# Patient Record
Sex: Male | Born: 1990 | Race: White | Hispanic: No | Marital: Single | Smoking: Current every day smoker
Health system: Southern US, Community
[De-identification: ages and names within clinical notes are randomized; demographics above are authoritative.]

## PROBLEM LIST (undated history)

## (undated) DIAGNOSIS — M25519 Pain in unspecified shoulder: Secondary | ICD-10-CM

## (undated) DIAGNOSIS — M6282 Rhabdomyolysis: Secondary | ICD-10-CM

## (undated) DIAGNOSIS — G8929 Other chronic pain: Secondary | ICD-10-CM

## (undated) DIAGNOSIS — F1910 Other psychoactive substance abuse, uncomplicated: Secondary | ICD-10-CM

---
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
## 2012-06-01 ENCOUNTER — Emergency Department (HOSPITAL_COMMUNITY): Payer: Self-pay

## 2012-06-01 ENCOUNTER — Encounter (HOSPITAL_COMMUNITY): Payer: Self-pay | Admitting: *Deleted

## 2012-06-01 ENCOUNTER — Emergency Department (HOSPITAL_COMMUNITY)
Admission: EM | Admit: 2012-06-01 | Discharge: 2012-06-01 | Disposition: A | Discharge status: Home / Self Care | Payer: Self-pay | Attending: Emergency Medicine | Admitting: Emergency Medicine

## 2012-06-01 DIAGNOSIS — S5000XA Contusion of unspecified elbow, initial encounter: Secondary | ICD-10-CM | POA: Insufficient documentation

## 2012-06-01 DIAGNOSIS — S5001XA Contusion of right elbow, initial encounter: Secondary | ICD-10-CM

## 2012-06-01 DIAGNOSIS — Y9389 Activity, other specified: Secondary | ICD-10-CM | POA: Insufficient documentation

## 2012-06-01 DIAGNOSIS — W06XXXA Fall from bed, initial encounter: Secondary | ICD-10-CM | POA: Insufficient documentation

## 2012-06-01 DIAGNOSIS — Y929 Unspecified place or not applicable: Secondary | ICD-10-CM | POA: Insufficient documentation

## 2012-06-01 MED ORDER — OXYCODONE-ACETAMINOPHEN 5-325 MG PO TABS: ORAL_TABLET | ORAL | Status: DC

## 2012-06-01 MED ORDER — ONDANSETRON 4 MG PO TBDP
4.0000 mg | ORAL_TABLET | Freq: Once | ORAL | Status: AC
  Administered 2012-06-01: 4 mg via ORAL
  Filled 2012-06-01: qty 1

## 2012-06-01 MED ORDER — KETOROLAC TROMETHAMINE 60 MG/2ML IM SOLN
60.0000 mg | Freq: Once | INTRAMUSCULAR | Status: AC
  Administered 2012-06-01: 60 mg via INTRAMUSCULAR
  Filled 2012-06-01: qty 2

## 2012-06-01 MED ORDER — HYDROMORPHONE HCL PF 1 MG/ML IJ SOLN
1.0000 mg | Freq: Once | INTRAMUSCULAR | Status: AC
  Administered 2012-06-01: 1 mg via INTRAMUSCULAR
  Filled 2012-06-01: qty 1

## 2012-06-01 NOTE — ED Notes (Signed)
Fell off bed landing on right elbow, c/o pain to area and unable to move right arm.

## 2012-06-01 NOTE — ED Provider Notes (Signed)
History     CSN: [id number]  Arrival date & time 06/01/12  1831   First MD Initiated Contact with Patient 06/01/12 1855      Chief Complaint  Patient presents with  . Elbow Pain    (Consider location/radiation/quality/duration/timing/severity/associated sxs/prior treatment) HPI Comments: Fell off bed onto R elbow.  No other injuries or complaints.  R hand dominant.  The history is provided by the patient. No language interpreter was used.    History reviewed. No pertinent past medical history.  History reviewed. No pertinent past surgical history.  No family history on file.  History  Substance Use Topics  . Smoking status: Never Smoker   . Smokeless tobacco: Not on file  . Alcohol Use: No      Review of Systems  Constitutional: Negative for fever and chills.  Musculoskeletal:       Elbow injury   Skin: Negative for wound.  Neurological: Negative for numbness.  All other systems reviewed and are negative.    Allergies  Review of patient's allergies indicates no known allergies.  Home Medications  No current outpatient prescriptions on file.  BP 111/62  Pulse 103  Temp 97.9 F (36.6 C) (Oral)  Resp 16  Ht 5\' 7"  (1.702 m)  Wt 145 lb (65.772 kg)  BMI 22.71 kg/m2  SpO2 99%  Physical Exam  Nursing note and vitals reviewed. Constitutional: He is oriented to person, place, and time. He appears well-developed and well-nourished.  HENT:  Head: Normocephalic and atraumatic.  Eyes: EOM are normal.  Neck: Normal range of motion.  Cardiovascular: Normal rate, regular rhythm and intact distal pulses.   Pulmonary/Chest: Effort normal. No respiratory distress.  Abdominal: Soft. He exhibits no distension. There is no tenderness.  Musculoskeletal: He exhibits tenderness.       Right elbow: He exhibits decreased range of motion. He exhibits no swelling, no effusion, no deformity and no laceration. tenderness found. Radial head and olecranon process tenderness  noted.       Pt can extend elbow to ~ 120 degrees.  He has worse pain in radial head area with attempt to supinate.  Skin intact.  Neurological: He is alert and oriented to person, place, and time.  Skin: Skin is warm and dry.  Psychiatric: He has a normal mood and affect. Judgment normal.    ED Course  Procedures (including critical care time)  Labs Reviewed - No data to display Dg Elbow Complete Right  06/01/2012  *RADIOLOGY REPORT*  Clinical Data: Elbow pain post fall  RIGHT ELBOW - COMPLETE 3+ VIEW  Comparison: None  Findings: Four views of the right elbow submitted.  No acute fracture or subluxation.  No posterior fat pad sign.  IMPRESSION: No acute fracture or subluxation.   Original Report Authenticated By: Natasha Mead, M.D.    Ct Elbow Right W/o Cm  06/01/2012  *RADIOLOGY REPORT*  Clinical Data: Larey Seat off bed landing on right elbow.  Complaining of generalized elbow pain.  CT OF THE RIGHT ELBOW WITHOUT CONTRAST  Technique:  Multidetector CT imaging was performed according to the standard protocol. Multiplanar CT image reconstructions were also generated.  Comparison: Current right elbow radiographs  Findings: No fracture or bone lesion.  The elbow joint is normally spaced and aligned.  No degenerative change.  No joint effusion.  Although limited, the biceps tendon, brachialis tendon and triceps tendons are intact.  The surrounding soft tissues are unremarkable.  IMPRESSION: Normal right elbow CT.  No fracture.   Original  Report Authenticated By: Amie Portland, M.D.      1. Contusion of right elbow       MDM  Sling, ice Ibuprofen rx-percocet, 20. Mom intends to find an orthopedist on her own.        Evalina Field, [place] 06/01/12 2205

## 2012-06-02 NOTE — ED Provider Notes (Signed)
Medical screening examination/treatment/procedure(s) were performed by non-physician practitioner and as supervising physician I was immediately available for consultation/collaboration.   Audryna Wendt, MD 06/02/12 0019 

## 2012-08-14 ENCOUNTER — Ambulatory Visit (HOSPITAL_COMMUNITY)
Admission: RE | Admit: 2012-08-14 | Discharge: 2012-08-14 | Disposition: A | Discharge status: Home / Self Care | Payer: BC Managed Care – PPO | Source: Ambulatory Visit | Attending: Internal Medicine | Admitting: Internal Medicine

## 2012-08-14 ENCOUNTER — Other Ambulatory Visit (HOSPITAL_COMMUNITY): Payer: Self-pay | Admitting: Internal Medicine

## 2012-08-14 DIAGNOSIS — M778 Other enthesopathies, not elsewhere classified: Secondary | ICD-10-CM

## 2012-08-14 DIAGNOSIS — M25529 Pain in unspecified elbow: Secondary | ICD-10-CM | POA: Insufficient documentation

## 2014-05-12 HISTORY — PX: ORIF PROXIMAL HUMERUS FRACTURE: SUR951

## 2014-06-04 ENCOUNTER — Emergency Department (HOSPITAL_COMMUNITY)
Admission: EM | Admit: 2014-06-04 | Discharge: 2014-06-04 | Disposition: A | Discharge status: Home / Self Care | Payer: Self-pay | Attending: Emergency Medicine | Admitting: Emergency Medicine

## 2014-06-04 ENCOUNTER — Emergency Department (HOSPITAL_COMMUNITY): Payer: Self-pay

## 2014-06-04 ENCOUNTER — Encounter (HOSPITAL_COMMUNITY): Payer: Self-pay | Admitting: *Deleted

## 2014-06-04 DIAGNOSIS — S42201A Unspecified fracture of upper end of right humerus, initial encounter for closed fracture: Secondary | ICD-10-CM

## 2014-06-04 DIAGNOSIS — T1490XA Injury, unspecified, initial encounter: Secondary | ICD-10-CM

## 2014-06-04 DIAGNOSIS — Y9289 Other specified places as the place of occurrence of the external cause: Secondary | ICD-10-CM | POA: Insufficient documentation

## 2014-06-04 DIAGNOSIS — S0990XA Unspecified injury of head, initial encounter: Secondary | ICD-10-CM | POA: Insufficient documentation

## 2014-06-04 DIAGNOSIS — S42211A Unspecified displaced fracture of surgical neck of right humerus, initial encounter for closed fracture: Secondary | ICD-10-CM | POA: Insufficient documentation

## 2014-06-04 DIAGNOSIS — Y998 Other external cause status: Secondary | ICD-10-CM | POA: Insufficient documentation

## 2014-06-04 DIAGNOSIS — Y9389 Activity, other specified: Secondary | ICD-10-CM | POA: Insufficient documentation

## 2014-06-04 MED ORDER — OXYCODONE-ACETAMINOPHEN 5-325 MG PO TABS: 1.0000 | ORAL_TABLET | ORAL | Status: DC | PRN

## 2014-06-04 MED ORDER — MORPHINE SULFATE 4 MG/ML IJ SOLN
4.0000 mg | Freq: Once | INTRAMUSCULAR | Status: AC
  Administered 2014-06-04: 4 mg via INTRAVENOUS
  Filled 2014-06-04: qty 1

## 2014-06-04 NOTE — Discharge Instructions (Signed)
Shoulder Fracture You have a fractured humerus (bone in the upper arm) at the shoulder just below the ball of the shoulder joint. Most of the time the bones of a broken shoulder are in an acceptable position. Usually the injury can be treated with a shoulder immobilizer or sling and swath bandage. These devices support the arm and prevent any shoulder movement. If the bones are not in a good position, then surgery is sometimes needed. Shoulder fractures usually cause swelling, pain, and discoloration around the upper arm initially. They heal in 8-12 weeks with proper treatment. Rest in bed or a reclining chair as long as your shoulder is very painful. Sitting up generally results in less pain at the fracture site. Do not remove your shoulder bandage until your caregiver approves. You may apply ice packs over the shoulder for 20-30 minutes every 2 hours for the next 2-3 days to reduce the pain and swelling. Use your pain medicine as prescribed.  SEEK IMMEDIATE MEDICAL CARE IF:  You develop severe shoulder pain unrelieved by rest and taking pain medicine.  You have pain, numbness, tingling, or weakness in the hand or wrist.  You develop shortness of breath, chest pain, severe weakness, or fainting.  You have severe pain with motion of the fingers or wrist. MAKE SURE YOU:   Understand these instructions.  Will watch your condition.  Will get help right away if you are not doing well or get worse. Document Released: 07/06/2004 Document Revised: 08/21/2011 Document Reviewed: 09/16/2008 [facility] [place] [facility]ExitCare Patient Information 2015 [employer], [place]. This information is not intended to replace advice given to you by your health care provider. Make sure you discuss any questions you have with your health care provider.  Acetaminophen; Oxycodone tablets What is this medicine? ACETAMINOPHEN; OXYCODONE (a set a MEE noe fen; ox i KOE done) is a pain reliever. It is used to treat mild to moderate pain. This medicine  may be used for other purposes; ask your health care provider or pharmacist if you have questions. COMMON BRAND NAME(S): Endocet, Magnacet, Narvox, Percocet, Perloxx, Primalev, Primlev, Roxicet, Xolox What should I tell my health care provider before I take this medicine? They need to know if you have any of these conditions: -brain tumor -Crohn's disease, inflammatory bowel disease, or ulcerative colitis -drug abuse or addiction -head injury -heart or circulation problems -if you often drink alcohol -kidney disease or problems going to the bathroom -liver disease -lung disease, asthma, or breathing problems -an unusual or allergic reaction to acetaminophen, oxycodone, other opioid analgesics, other medicines, foods, dyes, or preservatives -pregnant or trying to get pregnant -breast-feeding How should I use this medicine? Take this medicine by mouth with a full glass of water. Follow the directions on the prescription label. Take your medicine at regular intervals. Do not take your medicine more often than directed. Talk to your pediatrician regarding the use of this medicine in children. Special care may be needed. Patients over 21 years old may have a stronger reaction and need a smaller dose. Overdosage: If you think you have taken too much of this medicine contact a poison control center or emergency room at once. NOTE: This medicine is only for you. Do not share this medicine with others. What if I miss a dose? If you miss a dose, take it as soon as you can. If it is almost time for your next dose, take only that dose. Do not take double or extra doses. What may interact with this medicine? -alcohol -antihistamines -  barbiturates like amobarbital, butalbital, butabarbital, methohexital, pentobarbital, phenobarbital, thiopental, and secobarbital -benztropine -drugs for bladder problems like solifenacin, trospium, oxybutynin, tolterodine, hyoscyamine, and methscopolamine -drugs for  breathing problems like ipratropium and tiotropium -drugs for certain stomach or intestine problems like propantheline, homatropine methylbromide, glycopyrrolate, atropine, belladonna, and dicyclomine -general anesthetics like etomidate, ketamine, nitrous oxide, propofol, desflurane, enflurane, halothane, isoflurane, and sevoflurane -medicines for depression, anxiety, or psychotic disturbances -medicines for sleep -muscle relaxants -naltrexone -narcotic medicines (opiates) for pain -phenothiazines like perphenazine, thioridazine, chlorpromazine, mesoridazine, fluphenazine, prochlorperazine, promazine, and trifluoperazine -scopolamine -tramadol -trihexyphenidyl This list may not describe all possible interactions. Give your health care provider a list of all the medicines, herbs, non-prescription drugs, or dietary supplements you use. Also tell them if you smoke, drink alcohol, or use illegal drugs. Some items may interact with your medicine. What should I watch for while using this medicine? Tell your doctor or health care professional if your pain does not go away, if it gets worse, or if you have new or a different type of pain. You may develop tolerance to the medicine. Tolerance means that you will need a higher dose of the medication for pain relief. Tolerance is normal and is expected if you take this medicine for a long time. Do not suddenly stop taking your medicine because you may develop a severe reaction. Your body becomes used to the medicine. This does NOT mean you are addicted. Addiction is a behavior related to getting and using a drug for a non-medical reason. If you have pain, you have a medical reason to take pain medicine. Your doctor will tell you how much medicine to take. If your doctor wants you to stop the medicine, the dose will be slowly lowered over time to avoid any side effects. You may get drowsy or dizzy. Do not drive, use machinery, or do anything that needs mental  alertness until you know how this medicine affects you. Do not stand or sit up quickly, especially if you are an older patient. This reduces the risk of dizzy or fainting spells. Alcohol may interfere with the effect of this medicine. Avoid alcoholic drinks. There are different types of narcotic medicines (opiates) for pain. If you take more than one type at the same time, you may have more side effects. Give your health care provider a list of all medicines you use. Your doctor will tell you how much medicine to take. Do not take more medicine than directed. Call emergency for help if you have problems breathing. The medicine will cause constipation. Try to have a bowel movement at least every 2 to 3 days. If you do not have a bowel movement for 3 days, call your doctor or health care professional. Do not take Tylenol (acetaminophen) or medicines that have acetaminophen with this medicine. Too much acetaminophen can be very dangerous. Many nonprescription medicines contain acetaminophen. Always read the labels carefully to avoid taking more acetaminophen. What side effects may I notice from receiving this medicine? Side effects that you should report to your doctor or health care professional as soon as possible: -allergic reactions like skin rash, itching or hives, swelling of the face, lips, or tongue -breathing difficulties, wheezing -confusion -light headedness or fainting spells -severe stomach pain -unusually weak or tired -yellowing of the skin or the whites of the eyes Side effects that usually do not require medical attention (report to your doctor or health care professional if they continue or are bothersome): -dizziness -drowsiness -nausea -vomiting This list may  not describe all possible side effects. Call your doctor for medical advice about side effects. You may report side effects to FDA at 1-800-FDA-1088. Where should I keep my medicine? Keep out of the reach of children. This  medicine can be abused. Keep your medicine in a safe place to protect it from theft. Do not share this medicine with anyone. Selling or giving away this medicine is dangerous and against the law. Store at room temperature between 20 and 25 degrees C (68 and 77 degrees F). Keep container tightly closed. Protect from light. This medicine may cause accidental overdose and death if it is taken by other adults, children, or pets. Flush any unused medicine down the toilet to reduce the chance of harm. Do not use the medicine after the expiration date. NOTE: This sheet is a summary. It may not cover all possible information. If you have questions about this medicine, talk to your doctor, pharmacist, or health care provider.  2015, [employer]. (2013-01-20 13:17:35)

## 2014-06-04 NOTE — ED Notes (Signed)
Pt not cooperative, asked x5 to allow RN to place foam sling. Pt cursing. Father at bedside asks patient to watch his language.
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
## 2014-06-04 NOTE — ED Notes (Signed)
Pt refuses to apply pressure to his bandage at previous PIV site. NA holding pressure.

## 2014-06-04 NOTE — ED Notes (Signed)
Pt to department with [facility].  Pt reports pain in right shoulder.

## 2014-06-09 ENCOUNTER — Encounter: Payer: Self-pay | Admitting: Orthopedic Surgery

## 2014-06-09 ENCOUNTER — Ambulatory Visit (INDEPENDENT_AMBULATORY_CARE_PROVIDER_SITE_OTHER): Payer: Self-pay | Admitting: Orthopedic Surgery

## 2014-06-09 ENCOUNTER — Ambulatory Visit (INDEPENDENT_AMBULATORY_CARE_PROVIDER_SITE_OTHER): Payer: Self-pay

## 2014-06-09 VITALS — BP 115/77 | Ht 66.0 in | Wt 145.0 lb

## 2014-06-09 DIAGNOSIS — S42201A Unspecified fracture of upper end of right humerus, initial encounter for closed fracture: Secondary | ICD-10-CM

## 2014-06-09 DIAGNOSIS — S42411A Displaced simple supracondylar fracture without intercondylar fracture of right humerus, initial encounter for closed fracture: Secondary | ICD-10-CM

## 2014-06-09 MED ORDER — OXYCODONE-ACETAMINOPHEN 5-325 MG PO TABS: 1.0000 | ORAL_TABLET | ORAL | Status: DC | PRN

## 2014-06-09 NOTE — Patient Instructions (Signed)
Will refer to Dr Dion SaucierLandau
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
## 2014-06-10 ENCOUNTER — Telehealth: Payer: Self-pay | Admitting: *Deleted

## 2014-06-10 ENCOUNTER — Other Ambulatory Visit: Payer: Self-pay | Admitting: *Deleted

## 2014-06-10 DIAGNOSIS — S42211A Unspecified displaced fracture of surgical neck of right humerus, initial encounter for closed fracture: Secondary | ICD-10-CM | POA: Insufficient documentation

## 2014-06-10 DIAGNOSIS — S42201A Unspecified fracture of upper end of right humerus, initial encounter for closed fracture: Secondary | ICD-10-CM

## 2014-06-10 NOTE — Telephone Encounter (Signed)
REFERRAL SENT TO [facility]   APPOINTMENT WITH DR LANDAU Jun 15 2014 @ 9:30, PATIENT IS SELF PAY, PMT OF $250 DUE UP FRONT  CALLED PATIENT, UNAVAILABLE, LEFT MESSAGE TO RETURN CALL

## 2014-06-17 ENCOUNTER — Telehealth: Payer: Self-pay | Admitting: Orthopedic Surgery

## 2014-06-17 NOTE — Telephone Encounter (Signed)
Called Brad Todd to follow up on referral, patient no showed for appt.   Called patient, no answer

## 2014-06-17 NOTE — Telephone Encounter (Signed)
Patient is calling asking for a refill on his pain medication oxyCODONE-acetaminophen (PERCOCET) 5-325 MG per tablet  Please advise?

## 2014-06-18 ENCOUNTER — Other Ambulatory Visit: Payer: Self-pay | Admitting: *Deleted

## 2014-06-18 MED ORDER — OXYCODONE-ACETAMINOPHEN 5-325 MG PO TABS: 1.0000 | ORAL_TABLET | ORAL | Status: DC | PRN

## 2014-06-18 NOTE — Telephone Encounter (Signed)
Prescription available, limited with no addition refills Called patient, no answer

## 2014-06-18 NOTE — Telephone Encounter (Signed)
Attempted to return call yesterday, no answer, [facility] and no showed

## 2014-06-18 NOTE — Telephone Encounter (Signed)
Patient unable to seek follow up treatment for a few more weeks, requesting refill

## 2014-06-18 NOTE — Telephone Encounter (Signed)
20 tablets and no more prescriptions if he truly no showed

## 2014-06-19 ENCOUNTER — Encounter (HOSPITAL_COMMUNITY): Payer: Self-pay

## 2014-06-19 ENCOUNTER — Emergency Department (HOSPITAL_COMMUNITY): Payer: Self-pay

## 2014-06-19 ENCOUNTER — Emergency Department (HOSPITAL_COMMUNITY)
Admission: EM | Admit: 2014-06-19 | Discharge: 2014-06-19 | Disposition: A | Discharge status: Home / Self Care | Payer: Self-pay | Attending: Emergency Medicine | Admitting: Emergency Medicine

## 2014-06-19 DIAGNOSIS — S4991XA Unspecified injury of right shoulder and upper arm, initial encounter: Secondary | ICD-10-CM | POA: Insufficient documentation

## 2014-06-19 DIAGNOSIS — M25511 Pain in right shoulder: Secondary | ICD-10-CM

## 2014-06-19 DIAGNOSIS — Z8739 Personal history of other diseases of the musculoskeletal system and connective tissue: Secondary | ICD-10-CM | POA: Insufficient documentation

## 2014-06-19 DIAGNOSIS — Y9389 Activity, other specified: Secondary | ICD-10-CM | POA: Insufficient documentation

## 2014-06-19 DIAGNOSIS — R52 Pain, unspecified: Secondary | ICD-10-CM

## 2014-06-19 DIAGNOSIS — Z79899 Other long term (current) drug therapy: Secondary | ICD-10-CM | POA: Insufficient documentation

## 2014-06-19 DIAGNOSIS — W08XXXA Fall from other furniture, initial encounter: Secondary | ICD-10-CM | POA: Insufficient documentation

## 2014-06-19 DIAGNOSIS — W19XXXA Unspecified fall, initial encounter: Secondary | ICD-10-CM

## 2014-06-19 DIAGNOSIS — Z9889 Other specified postprocedural states: Secondary | ICD-10-CM | POA: Insufficient documentation

## 2014-06-19 DIAGNOSIS — Y998 Other external cause status: Secondary | ICD-10-CM | POA: Insufficient documentation

## 2014-06-19 DIAGNOSIS — Y9289 Other specified places as the place of occurrence of the external cause: Secondary | ICD-10-CM | POA: Insufficient documentation

## 2014-06-19 HISTORY — DX: Rhabdomyolysis: M62.82

## 2014-06-19 MED ORDER — DIAZEPAM 5 MG PO TABS
5.0000 mg | ORAL_TABLET | Freq: Once | ORAL | Status: AC
  Administered 2014-06-19: 5 mg via ORAL
  Filled 2014-06-19: qty 1

## 2014-06-19 MED ORDER — HYDROMORPHONE HCL 1 MG/ML IJ SOLN
1.0000 mg | Freq: Once | INTRAMUSCULAR | Status: AC
  Administered 2014-06-19: 1 mg via INTRAMUSCULAR

## 2014-06-19 MED ORDER — HYDROMORPHONE HCL 1 MG/ML IJ SOLN
1.0000 mg | Freq: Once | INTRAMUSCULAR | Status: DC
  Filled 2014-06-19: qty 1

## 2014-06-19 MED ORDER — KETOROLAC TROMETHAMINE 30 MG/ML IJ SOLN
30.0000 mg | Freq: Once | INTRAMUSCULAR | Status: AC
  Administered 2014-06-19: 30 mg via INTRAMUSCULAR
  Filled 2014-06-19: qty 1

## 2014-06-19 NOTE — ED Notes (Signed)
Pt states he braced himself from falling this morning. Pt states has not taking any of his pain meds from home.

## 2014-06-19 NOTE — ED Notes (Signed)
Good pulses present. Pt states decreased sensation in fingers since falling. Extrimity is warm to the touch.

## 2014-06-19 NOTE — ED Notes (Signed)
Pt alert & oriented x4, stable gait. Patient given discharge instructions, paperwork & prescription(s). Patient verbalized understanding. Pt left department by wheelchair w/ no further questions. 
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
## 2014-06-19 NOTE — ED Provider Notes (Signed)
CSN: [id number]     Arrival date & time 06/19/14  2056 History  This chart was scribed for Raeford Razor, MD by Abel Presto, ED Scribe. This patient was seen in room APA04/APA04 and the patient's care was started at 9:41 PM.    Chief Complaint  Patient presents with  . Shoulder Pain    Patient is a 24 y.o. male presenting with shoulder pain. The history is provided by the patient. No language interpreter was used.  Shoulder Pain   HPI Comments: Brad Todd is a 24 y.o. male who presents to the Emergency Department complaining of right shoulder pain. Pt notes he slipped and fell. He states he is right handed and moved his righ arm to catch his fall. Pt recently had surgery on his right shoulder. Pt notes he felt his pins move when he fell.  Pt's arm is currently in a sling. Pt is in noticeable pain. Pt has NKDA.   Past Medical History  Diagnosis Date  . Rhabdomyolysis    History reviewed. No pertinent past surgical history. History reviewed. No pertinent family history. History  Substance Use Topics  . Smoking status: Never Smoker   . Smokeless tobacco: Not on file  . Alcohol Use: No    Review of Systems  Musculoskeletal: Positive for myalgias and arthralgias.  All other systems reviewed and are negative.     Allergies  Review of patient's allergies indicates no known allergies.  Home Medications   Prior to Admission medications   Medication Sig Start Date End Date Taking? Authorizing Provider  acetaminophen (TYLENOL) 325 MG tablet Take 325 mg by mouth every 6 (six) hours as needed.    Historical Provider, MD  ibuprofen (ADVIL,MOTRIN) 200 MG tablet Take 200 mg by mouth every 6 (six) hours as needed.    Historical Provider, MD  oxyCODONE-acetaminophen (PERCOCET) 5-325 MG per tablet Take 1 tablet by mouth every 4 (four) hours as needed for moderate pain. 06/04/14   Dione Booze, MD  oxyCODONE-acetaminophen (PERCOCET/ROXICET) 5-325 MG per tablet Take 1 tablet by mouth  every 4 (four) hours as needed for severe pain. 06/18/14   Vickki Hearing, MD  zolpidem (AMBIEN) 10 MG tablet Take 10 mg by mouth at bedtime as needed for sleep.    Historical Provider, MD   BP 123/71 mmHg  Pulse 105  Temp(Src) 98.2 F (36.8 C) (Oral)  Resp 20  Ht  (1.702 m)  Wt 145 lb (65.772 kg)  BMI 22.71 kg/m2  SpO2 97% Physical Exam  Constitutional: He appears well-developed and well-nourished. No distress.  HENT:  Head: Normocephalic and atraumatic.  Eyes: Conjunctivae are normal. Right eye exhibits no discharge. Left eye exhibits no discharge.  Neck: Neck supple.  Cardiovascular: Normal rate, regular rhythm and normal heart sounds.  Exam reveals no gallop and no friction rub.   No murmur heard. Pulmonary/Chest: Effort normal and breath sounds normal. No respiratory distress.  Abdominal: Soft. He exhibits no distension. There is no tenderness.  Musculoskeletal: He exhibits no edema or tenderness.  Incision to right shoulder CDI Diffuse right shoulder and proximal humerus tenderness NVI distally  Neurological: He is alert.  Skin: Skin is warm and dry.  Psychiatric: He has a normal mood and affect. His behavior is normal. Thought content normal.  Nursing note and vitals reviewed.   ED Course  Procedures (including critical care time) DIAGNOSTIC STUDIES: Oxygen Saturation is 97% on room air, normal by my interpretation.    COORDINATION OF CARE: 9:43  PM Discussed treatment plan with patient at beside, the patient agrees with the plan and has no further questions at this time.   Labs Review Labs Reviewed - No data to display  Imaging Review Dg Shoulder Right  06/19/2014   CLINICAL DATA:  Right shoulder pain. Surgery 8 days ago. Shallow head tonight. Severe pain. Limited range of motion.  EXAM: RIGHT SHOULDER - 2+ VIEW  COMPARISON:  06/04/2014  FINDINGS: Interval postoperative changes with plate and screw fixation of a comminuted fracture of the right proximal  humerus. Fracture lines remain visible and small displaced fragments are identified. Limited views are obtained. There is no obvious evidence of new acute fracture or dislocation. Acromioclavicular and coracoclavicular spaces are maintained.  IMPRESSION: Internal fixation of comminuted fractures of the proximal right humerus. No definite evidence of any acute bony abnormality.   Electronically Signed   By: Burman NievesWilliam  Stevens M.D.   On: 06/19/2014 21:46     EKG Interpretation None      MDM   Final diagnoses:  Fall  Pain    23yM with R shoulder pain. [facility] Pt requesting prescriptions for pain medication. Discharged with #180 oxycodone 5mg  tabs, 5-15mg  every 4 hours PRN. He states he is currently out despite this being day #7 of 10 day supply. Dosed meds in ED but explained to him why would not getting additional narcotic pain medication and that future requests for pain medication should be directed towards his surgeon. XR today w/o obvious hardware malalignment. NVI. Needs to follow-up with his surgeon. Return precautions discussed.   I personally preformed the services scribed in my presence. The recorded information has been reviewed is accurate. Raeford RazorStephen Iona Stay, MD.     Raeford RazorStephen Izayah Miner, MD 06/22/14 [id number]

## 2014-06-23 NOTE — Telephone Encounter (Signed)
Patient picked up Rx

## 2014-07-22 DIAGNOSIS — Z8781 Personal history of (healed) traumatic fracture: Secondary | ICD-10-CM | POA: Insufficient documentation

## 2015-06-04 ENCOUNTER — Emergency Department (HOSPITAL_COMMUNITY): Payer: Self-pay

## 2015-06-04 ENCOUNTER — Encounter (HOSPITAL_COMMUNITY): Payer: Self-pay | Admitting: Emergency Medicine

## 2015-06-04 ENCOUNTER — Emergency Department (HOSPITAL_COMMUNITY)
Admission: EM | Admit: 2015-06-04 | Discharge: 2015-06-04 | Disposition: A | Discharge status: Home / Self Care | Payer: Self-pay | Attending: Emergency Medicine | Admitting: Emergency Medicine

## 2015-06-04 DIAGNOSIS — R040 Epistaxis: Secondary | ICD-10-CM | POA: Insufficient documentation

## 2015-06-04 DIAGNOSIS — Z79899 Other long term (current) drug therapy: Secondary | ICD-10-CM | POA: Insufficient documentation

## 2015-06-04 DIAGNOSIS — R05 Cough: Secondary | ICD-10-CM | POA: Insufficient documentation

## 2015-06-04 DIAGNOSIS — R42 Dizziness and giddiness: Secondary | ICD-10-CM | POA: Insufficient documentation

## 2015-06-04 DIAGNOSIS — R0789 Other chest pain: Secondary | ICD-10-CM | POA: Insufficient documentation

## 2015-06-04 DIAGNOSIS — F1410 Cocaine abuse, uncomplicated: Secondary | ICD-10-CM | POA: Insufficient documentation

## 2015-06-04 DIAGNOSIS — F1110 Opioid abuse, uncomplicated: Secondary | ICD-10-CM | POA: Insufficient documentation

## 2015-06-04 DIAGNOSIS — M25511 Pain in right shoulder: Secondary | ICD-10-CM | POA: Insufficient documentation

## 2015-06-04 DIAGNOSIS — G8929 Other chronic pain: Secondary | ICD-10-CM | POA: Insufficient documentation

## 2015-06-04 DIAGNOSIS — F1910 Other psychoactive substance abuse, uncomplicated: Secondary | ICD-10-CM

## 2015-06-04 DIAGNOSIS — F1310 Sedative, hypnotic or anxiolytic abuse, uncomplicated: Secondary | ICD-10-CM | POA: Insufficient documentation

## 2015-06-04 HISTORY — DX: Other psychoactive substance abuse, uncomplicated: F19.10

## 2015-06-04 HISTORY — DX: Pain in unspecified shoulder: M25.519

## 2015-06-04 HISTORY — DX: Other chronic pain: G89.29

## 2015-06-04 LAB — BASIC METABOLIC PANEL
Anion gap: 8 (ref 5–15)
BUN: 10 mg/dL (ref 6–20)
CO2: 25 mmol/L (ref 22–32)
Calcium: 9.2 mg/dL (ref 8.9–10.3)
Chloride: 104 mmol/L (ref 101–111)
Creatinine, Ser: 0.88 mg/dL (ref 0.61–1.24)
GFR calc non Af Amer: >60 mL/min (ref >60)
Glucose, Bld: 90 mg/dL (ref 65–99)
Potassium: 4.1 mmol/L (ref 3.5–5.1)
SODIUM: 137 mmol/L (ref 135–145)

## 2015-06-04 LAB — CBC WITH DIFFERENTIAL/PLATELET
Basophils Absolute: 0 10*3/uL (ref 0.0–0.1)
Basophils Relative: 0 %
Eosinophils Absolute: 0.3 10*3/uL (ref 0.0–0.7)
Eosinophils Relative: 4 %
HEMATOCRIT: 42.3 % (ref 39.0–52.0)
HEMOGLOBIN: 14.5 g/dL (ref 13.0–17.0)
LYMPHS ABS: 4.2 10*3/uL — AB (ref 0.7–4.0)
Lymphocytes Relative: 48 %
MCH: 29.1 pg (ref 26.0–34.0)
MCHC: 34.3 g/dL (ref 30.0–36.0)
MCV: 84.9 fL (ref 78.0–100.0)
Monocytes Absolute: 0.9 10*3/uL (ref 0.1–1.0)
Monocytes Relative: 10 %
NEUTROS PCT: 38 %
Neutro Abs: 3.3 10*3/uL (ref 1.7–7.7)
Platelets: 246 10*3/uL (ref 150–400)
RBC: 4.98 MIL/uL (ref 4.22–5.81)
RDW: 13.2 % (ref 11.5–15.5)
WBC: 8.7 10*3/uL (ref 4.0–10.5)

## 2015-06-04 LAB — RAPID URINE DRUG SCREEN, HOSP PERFORMED
Amphetamines: NOT DETECTED
BARBITURATES: NOT DETECTED
BENZODIAZEPINES: POSITIVE — AB
Cocaine: POSITIVE — AB
Opiates: POSITIVE — AB
Tetrahydrocannabinol: NOT DETECTED

## 2015-06-04 LAB — TROPONIN I: Troponin I: <0.03 ng/mL (ref <0.031)

## 2015-06-04 LAB — CK: Total CK: 112 U/L (ref 49–397)

## 2015-06-04 MED ORDER — ACETAMINOPHEN 325 MG PO TABS
650.0000 mg | ORAL_TABLET | Freq: Once | ORAL | Status: AC
  Administered 2015-06-04: 650 mg via ORAL
  Filled 2015-06-04: qty 2

## 2015-06-04 MED ORDER — METHOCARBAMOL 500 MG PO TABS: 1000.0000 mg | ORAL_TABLET | Freq: Four times a day (QID) | ORAL | Status: DC | PRN

## 2015-06-04 MED ORDER — NAPROXEN 250 MG PO TABS: 250.0000 mg | ORAL_TABLET | Freq: Two times a day (BID) | ORAL | Status: DC | PRN

## 2015-06-04 MED ORDER — GI COCKTAIL ~~LOC~~
30.0000 mL | Freq: Once | ORAL | Status: AC
  Administered 2015-06-04: 30 mL via ORAL
  Filled 2015-06-04: qty 30
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
## 2015-06-04 NOTE — Discharge Instructions (Signed)
°Emergency Department Resource Guide °1) Find a Doctor and Pay Out of Pocket °Although you won't have to find out who is covered by your insurance plan, it is a good idea to ask around and get recommendations. You will then need to call the office and see if the doctor you have chosen will accept you as a new patient and what types of options they offer for patients who are self-pay. Some doctors offer discounts or will set up payment plans for their patients who do not have insurance, but you will need to ask so you aren't surprised when you get to your appointment. ° °2) Contact Your Local Health Department °Not all health departments have doctors that can see patients for sick visits, but many do, so it is worth a call to see if yours does. If you don't know where your local health department is, you can check in your phone book. The CDC also has a tool to help you locate your state's health department, and many state websites also have listings of all of their local health departments. ° °3) Find a Walk-in Clinic °If your illness is not likely to be very severe or complicated, you may want to try a walk in clinic. These are popping up all over the country in pharmacies, drugstores, and shopping centers. They're usually staffed by nurse practitioners or physician assistants that have been trained to treat common illnesses and complaints. They're usually fairly quick and inexpensive. However, if you have serious medical issues or chronic medical problems, these are probably not your best option. ° °No Primary Care Doctor: °- Call [facility] at  832-8000 - they can help you locate a primary care doctor that  accepts your insurance, provides certain services, etc. °- Physician Referral Service- 1-800-533-3463 ° °Chronic Pain Problems: °Organization         Address  Phone   Notes  °[facility]  (336) [phone] Patients need to be referred by their primary care doctor.  ° °Medication  Assistance: °Organization         Address  Phone   Notes  °[place] [facility] [address] °[place], [place] [address] (336) 641-8030 --Must be a resident of [place] °-- Must have NO insurance coverage whatsoever (no Medicaid/ Medicare, etc.) °-- The pt. MUST have a primary care doctor that directs their care regularly and follows them in the community °  °[facility]  (866) 331-1348   °[employer]  (888) 892-1162   ° °Agencies that provide inexpensive medical care: °Organization         Address  Phone   Notes  °[facility]  (336) 832-8035   °[facility]    (336) 832-7272   °[facility] [address] °[place], [place] [address] (336) 832-4777   °[facility] [address], °[place] (336) 271-4999   °[facility]    (336) 373-0678   °[facility]    (336) 272-1050   °[facility] ° [address], [place] Phone:  (336) 832-4444, Fax:  (336) 832-4440 Hours of Operation:  9 am - 6 pm, M-F.  Also accepts Medicaid/Medicare and self-pay.  °[facility] ° [address], [place] Phone: (336) [phone], Fax: (336) [phone]. Hours of Operation:  8:30 am - 5:30 pm, M-F.  Also accepts Medicaid and self-pay.  °[facility] [place] [address], [place] Phone: (336) 878-6027   °[facility] [address], [place], [place] (336)723-1848, Ext. 123 Mondays & Thursdays: 7-9 AM.  First 15 patients are seen on a first come, first serve basis. °  ° °Medicaid-accepting [place] Providers: ° °Organization         Address  Phone   Notes  °[facility] [address], [place] (336) 641-2100 Also accepts self-pay patients.  °[facility] [address], [place] ° (336) 856-9996   °[facility] [address], [place]  (336) 288-8857   °[facility] [address] [place] [address], [place] (336) 299-7000   °Veita Bland [address], [place]  ° (336) 373-1557 Only accepts [place] Access Medicaid patients after they have their name applied to their card.  ° °Self-Pay (no insurance) in [place]: ° °Organization         Address  Phone   Notes  °Sickle Cell Patients, [facility] [address], [place] (336) 832-1970   °[facility] [address], [place] (336) 832-4400   °[facility] ° [address], [place] (336) 992-4800   °[facility]/Dr. Osei-Bonsu ° [address] [place] [address], [place] or [address], [place] (336) [phone] Phone number for both [place] and [place] locations is the same.  °[facility] [address], [place] (336) 299-0000   °[facility] [address] [place] [address], [place] or [address] (336) 852-7530 °(336) 878-2260   °[facility] [address], [place] (336) 350-1642, phone; (336) 294-5005, fax Sees patients 1st and 3rd Saturday of every month.  Must not qualify for public or private insurance (i.e. Medicaid, Medicare, [place] Health Choice, Veterans' Benefits) • Household income should be no more than 200% of the poverty level •The clinic cannot treat you if you are pregnant or think you are pregnant • Sexually transmitted diseases are not treated at the clinic.  ° ° °Dental Care: °Organization         Address  Phone  Notes  °[place] [facility] [address], [place] (336) 641-6152 Accepts children up to age 21 who are enrolled in Medicaid or [place] Health Choice; pregnant women with a Medicaid card; and children who have applied for Medicaid or [place] Health Choice, but were declined, whose parents can pay a reduced fee at time of service.  °[place]  [facility] [place]  [address], [place] (336) 641-7733 Accepts children up to age 21 who are enrolled in Medicaid or [place] Health Choice; pregnant women with a Medicaid card; and children who have applied for Medicaid or [place] Health Choice, but were declined, whose parents can pay a reduced fee at time of service.  °[facility] ° [address], [place] (336) 641-4533 Patients are seen by appointment only. Walk-ins are not accepted. [facility] will see patients 18 years of age and older. °Monday - Tuesday (8am-5pm) °Most Wednesdays (8:30-5pm) °$30 per visit, cash only  °[facility] ° [address], [place] (336) 641-4533 Patients are seen by appointment only. Walk-ins are not accepted. [facility] will see patients 18 years of age and older. °One   Wednesday Evening (Monthly: Volunteer Based).  $30 per visit, cash only  °[facility]  (919) [phone] for adults; Children under age 4, call Graduate Pediatric Dentistry at ([phone]) [phone]. Children aged 4-14, please call ([phone]) [phone] to request a pediatric application. ° Dental services are provided in all areas of dental care including fillings, crowns and bridges, complete and partial dentures, implants, gum treatment, root canals, and extractions. Preventive care is also provided. Treatment is provided to both adults and children. °Patients are selected via a lottery and there is often a waiting list. °  °[facility] [address], °[place] ° (336) 763-8833 [email or web address] °  °[facility] [address], [place], [place] (336)723-1848, Ext. 123 Second and Fourth Thursday of each month, opens at 6:30 AM; Clinic ends at 9 AM.  Patients are seen on a first-come first-served basis, and a limited number are seen during each clinic.  ° °[facility] ° [address], [place], [place] (336) 723-7904    Eligibility Requirements °You must have lived in [place], [place], or [place] counties for at least the last three months. °  You cannot be eligible for state or federal sponsored healthcare insurance, including Veterans Administration, Medicaid, or Medicare. °  You generally cannot be eligible for healthcare insurance through your employer.  °  How to apply: °Eligibility screenings are held every Tuesday and Wednesday afternoon from 1:00 pm until 4:00 pm. You do not need an appointment for the interview!  °[facility] [address], [place], [place] 336-631-2330   °[facility]  336-342-8273   °[facility]  336-703-3100   °[facility]  336-570-6415   ° °Behavioral Health Resources in the Community: °Intensive Outpatient Programs °Organization         Address  Phone  Notes  °[facility] [address], [place], [place] 336-878-6098   °[facility] [address], [place], [place] 336-832-9800   °[facility] [address], [place], [place] ° 336-882-2125   °[facility] [address],  °[place], [place] 1-800-853-5163 or 336-641-4981   °Substance Abuse Resources °Organization         Address  Phone  Notes  °[facility]  336-882-2125   °[facility]  336-784-9470   °[facility]  336-285-9073   °[facility]  336-845-3988   °Residential & Outpatient Substance Abuse Program  1-800-659-3381   °Psychological Services °Organization         Address  Phone  Notes  °[facility]  336- 832-9600   °[facility]  336- 378-7881   °[facility] [address], [place] 1-800-853-5163 or 336-641-4981   ° °Mobile Crisis Teams °Organization         Address  Phone  Notes  °[facility], Mobile Crisis Care Unit  1-877-626-1772   °Assertive °[facility] ° [address]  [place], [place] 336-834-9664   °Sharon DeEsch [address] °[place] 336-554-5454   ° °Self-Help/Support Groups °Organization         Address  Phone             Notes  °[facility]  - variety of support groups  336- 373-1402 Call for more information  °Narcotics Anonymous (NA), [facility] [address], °[place]  2 meetings at this location  ° °  Residential Treatment Programs Organization         Address  Phone  Notes  [facility] [address],    [place]  1-610-960-45401-(365)866-2214   [facility]  [address], [place] [phone], [place], [place] [phone]   [facility] [address], [place] [phone] Admissions: 8am-3pm M-F  [facility] [address]    [place], [place] [phone]   [facility] [address], [place], [place] [phone]   [facility] [address]  [place], [place] [phone]) [phone]   [facility] Intensive Outpatient [address], [place], [place] [phone]   [facility] [address]  [place], [place] [phone] or [phone]   [facility] [address] [place], [place] [phone] Accepts Medicaid  [facility] [address]  [place] [phone] Substance Abuse/Addiction Treatment   [facility]         Address  Phone  Notes  [facility]  (303)725-7751(888) (775) 247-3426   Angie FavaJulie Brannon, PhD [address], [address] [place], [place]   ([phone](336) [phone] or [phone](336) [phone]   [facility]    [address] [place], [place] [phone]) [phone]   [facility] [address], [place], [place] [phone](336) [phone] Insurance/Medicaid/sponsorship through [facility] [address]                                    [place], [place] [phone](336) [phone] Therapy/tele-psych/case    [facility] [address]   [place], [place] [phone](336) ([phone]) [phone]    Dr. Lolly MustacheArfeen  [phone](336) [phone]   [facility]  [facility] 1) [address], [place] 2) [address], [place] 3)  [address], [place] [phone](336) [phone](336) [phone]  [phone](336) [phone]   [facility] [phone](336) 520-134-5523 or [phone](336) [phone] (After Hours)      Take the prescriptions as directed.  Use over the counter normal saline nasal spray, as directed on packaging, at least twice a day (especially before bedtime) for the next several weeks.  If you have a nosebleed:  Blow your nose gently, then lean your head forward and pinch both of your nostrils firmly and continuously for at least 15 to 20 minutes.  Apply moist heat or ice to the area(s) of discomfort, for 15 minutes at a time, several times per day for the next few days.  Do not fall asleep on a heating or ice pack.  Call your regular medical doctor, your Orthopedist, and the ENT doctor on Monday to schedule a follow up appointment within the next week.  Return to the Emergency Department immediately if worsening.

## 2015-06-04 NOTE — ED Notes (Signed)
Having dizziness for last two days.  Woke this am with nose bleed.  C/o pain to right shoulder, rates p;ain 8/10.  C/o pressure to chest pressure, rates pain 8/10.

## 2016-02-17 IMAGING — CR DG SHOULDER 2+V*L*
3 series · 3 of 3 positions shown · non-contrast
Comparison: None.

CLINICAL DATA: Pain following fall

EXAM:
LEFT SHOULDER - 2+ VIEW

[view not recorded (1 of 3)]
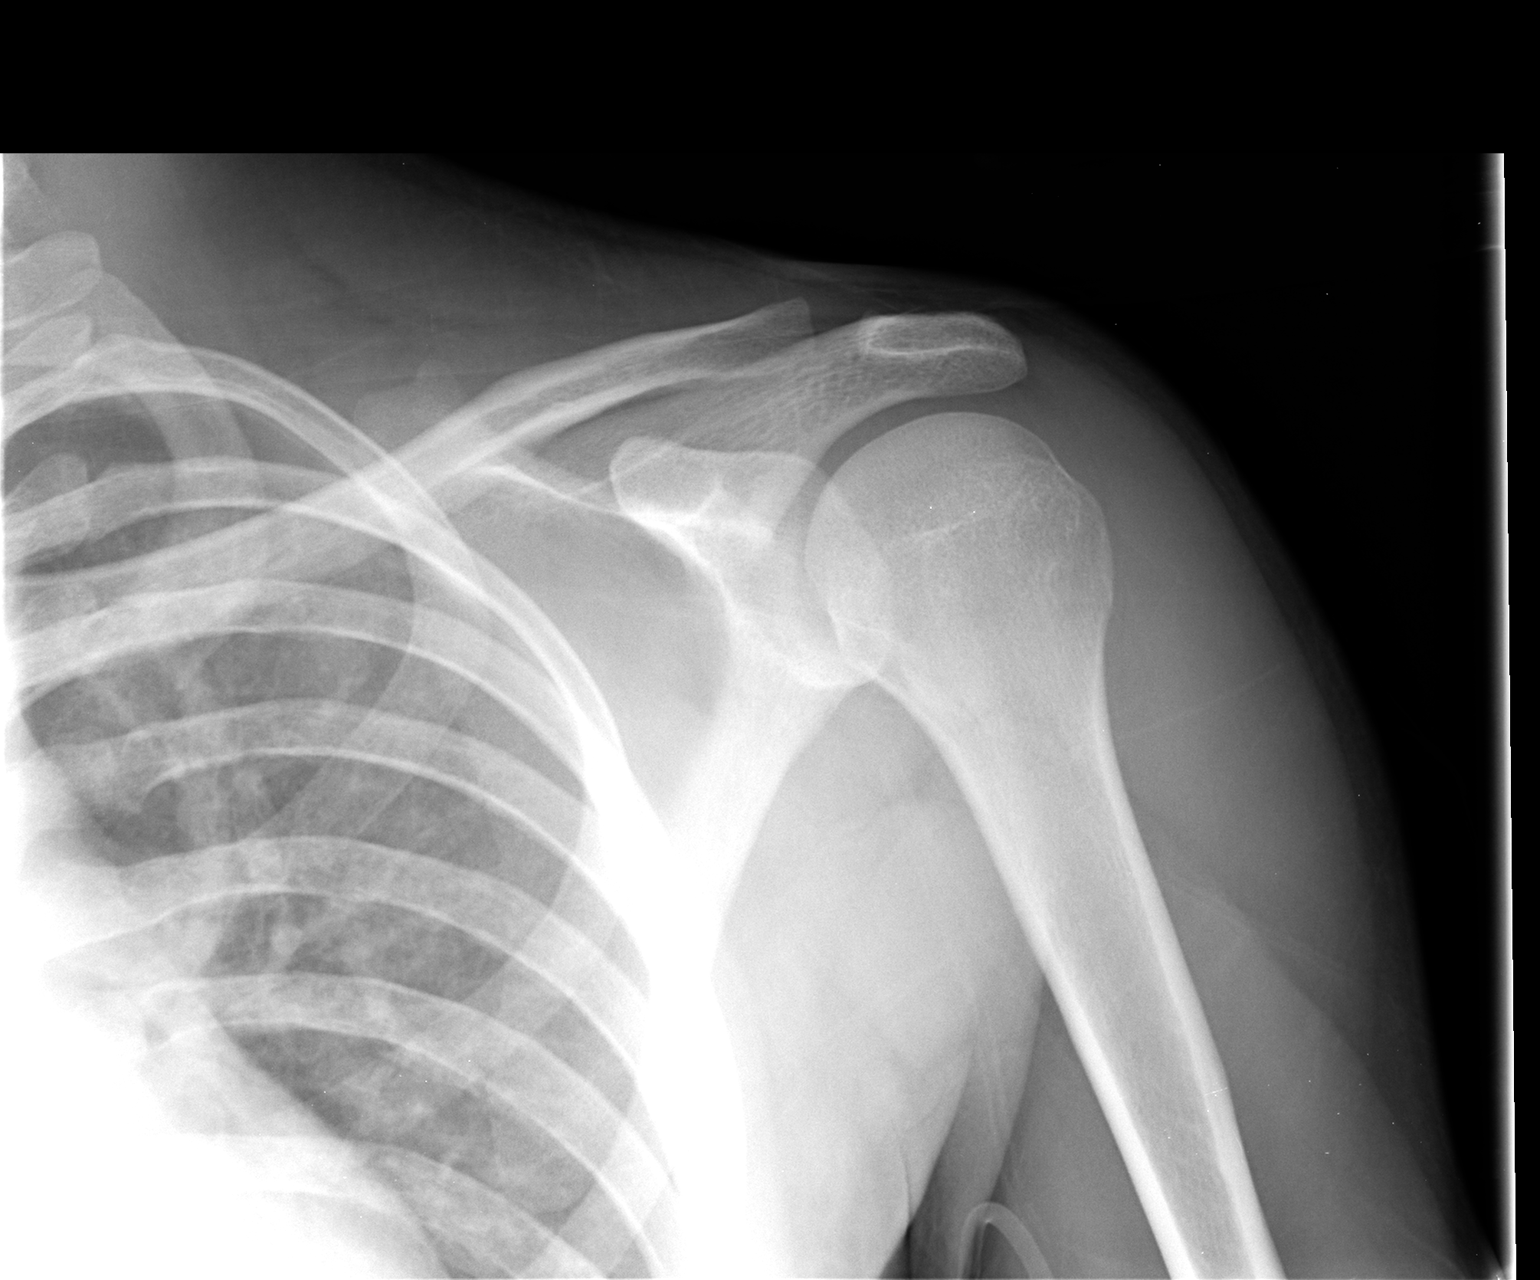

[view not recorded (2 of 3)]
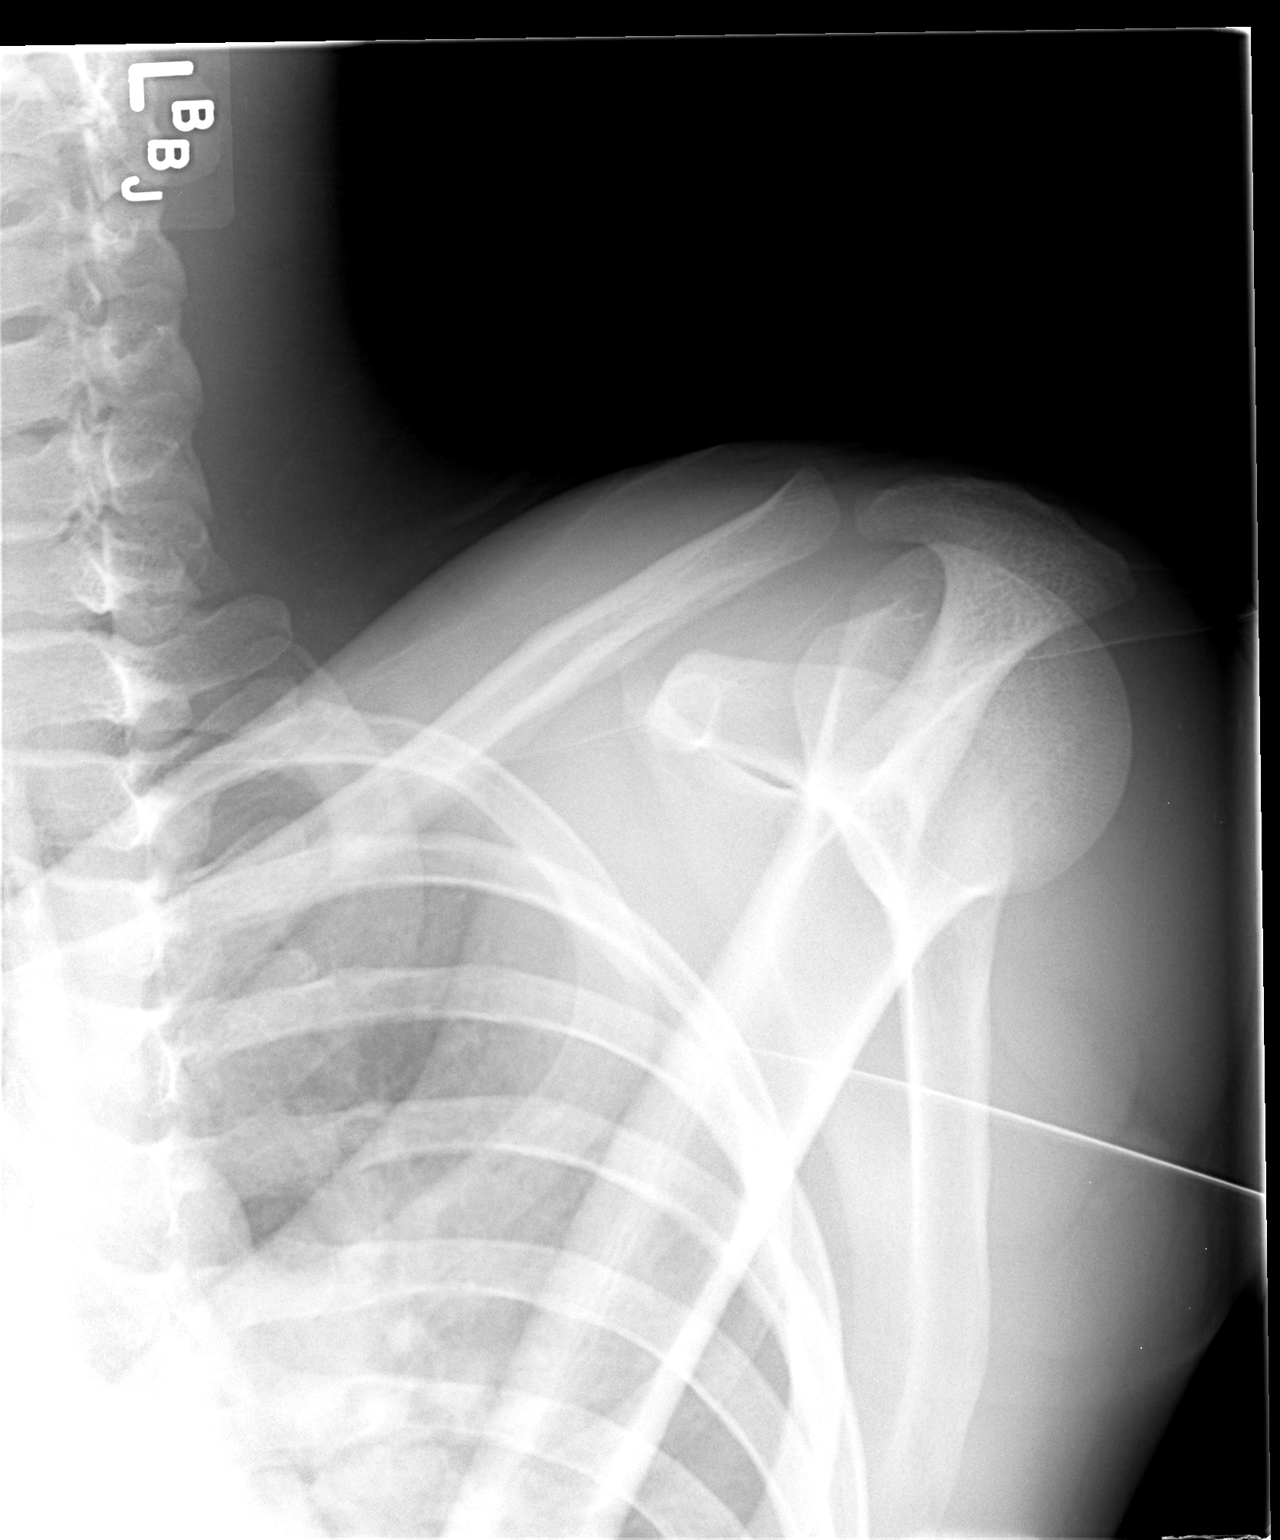

[view not recorded (3 of 3)]
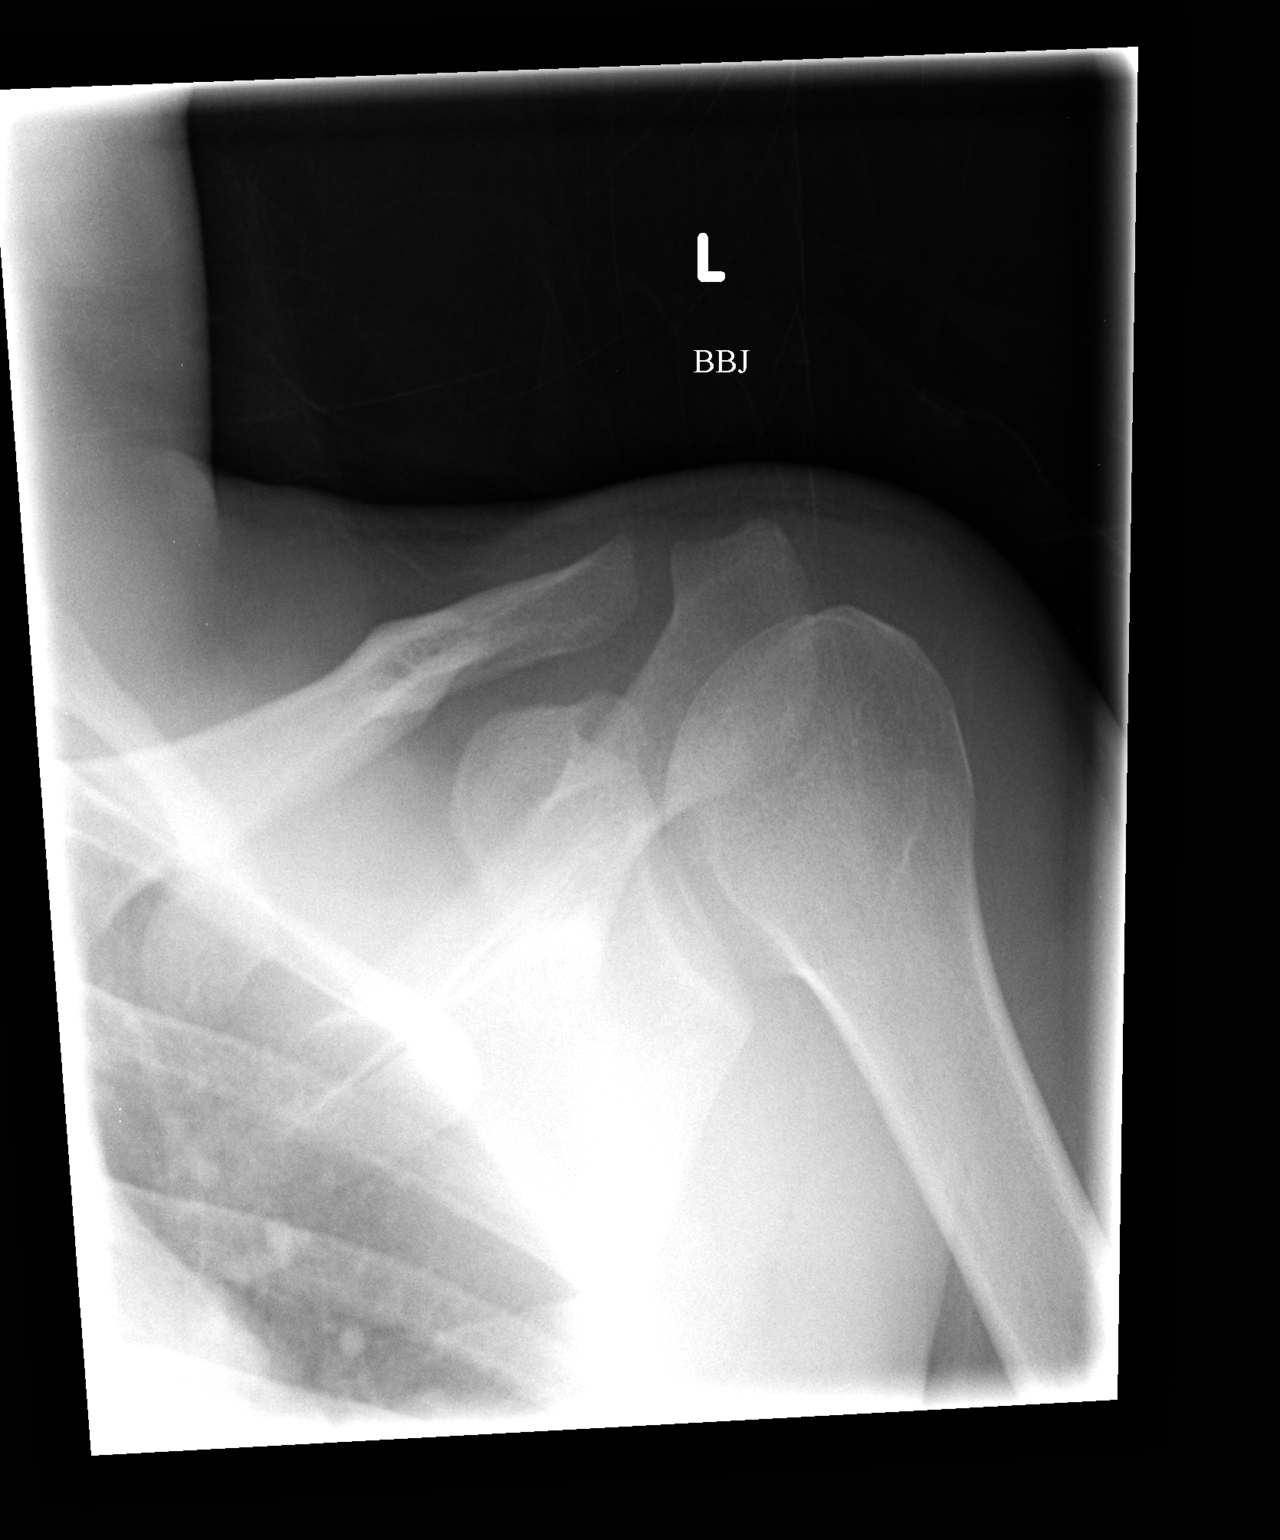

[3 of 3 positions shown; findings below may reference images not displayed]

FINDINGS: Frontal, Y scapular, and oblique images were obtained. There is no
fracture or dislocation. Joint spaces appear intact. No erosive
change.
IMPRESSION: No fracture or dislocation.  No appreciable arthropathy.

## 2016-02-27 ENCOUNTER — Encounter (HOSPITAL_COMMUNITY): Payer: Self-pay | Admitting: *Deleted

## 2016-02-27 ENCOUNTER — Emergency Department (HOSPITAL_COMMUNITY)
Admission: EM | Admit: 2016-02-27 | Discharge: 2016-02-27 | Disposition: A | Discharge status: Home / Self Care | Payer: Self-pay | Attending: Emergency Medicine | Admitting: Emergency Medicine

## 2016-02-27 DIAGNOSIS — L02214 Cutaneous abscess of groin: Secondary | ICD-10-CM | POA: Insufficient documentation

## 2016-02-27 DIAGNOSIS — K0889 Other specified disorders of teeth and supporting structures: Secondary | ICD-10-CM | POA: Insufficient documentation

## 2016-02-27 MED ORDER — IBUPROFEN 600 MG PO TABS: 600.0000 mg | ORAL_TABLET | Freq: Four times a day (QID) | ORAL | 0 refills | Status: DC | PRN

## 2016-02-27 MED ORDER — IBUPROFEN 800 MG PO TABS
800.0000 mg | ORAL_TABLET | Freq: Once | ORAL | Status: AC
  Administered 2016-02-27: 800 mg via ORAL
  Filled 2016-02-27: qty 1

## 2016-02-27 MED ORDER — ACETAMINOPHEN 500 MG PO TABS: 1000.0000 mg | ORAL_TABLET | Freq: Four times a day (QID) | ORAL | 0 refills | Status: DC | PRN

## 2016-02-27 MED ORDER — CLINDAMYCIN HCL 300 MG PO CAPS: 300.0000 mg | ORAL_CAPSULE | Freq: Four times a day (QID) | ORAL | 0 refills | Status: DC

## 2016-02-27 MED ORDER — CLINDAMYCIN HCL 150 MG PO CAPS
300.0000 mg | ORAL_CAPSULE | Freq: Once | ORAL | Status: AC
  Administered 2016-02-27: 300 mg via ORAL
  Filled 2016-02-27: qty 2

## 2016-02-27 NOTE — ED Triage Notes (Addendum)
Pt states he has been running a fever x 1 week the highest it has been was 102.5. [facility] because according to patient the jail isn't "allowed" to give medications. Pt states he has an abscessed tooth and an abscess near his groin area. Pt had a temp today at the jail of 102.1 and a heart rate of 144. Pt was given Bactrim for 3 days.

## 2016-02-27 NOTE — ED Provider Notes (Signed)
[facility]-EMERGENCY DEPT Provider Note   CSN: [id number] Arrival date & time: 02/27/16  2025  By signing my name below, I, Alyssa Grove, attest that this documentation has been prepared under the direction and in the presence of Geoffery Lyons, MD. Electronically Signed: Alyssa Grove, ED Scribe. 02/27/16. 9:37 PM.   History   Chief Complaint Chief Complaint  Patient presents with  . Fever   The history is provided by the patient. No language interpreter was used.  Fever      HPI Comments: Brad Todd is a 25 y.o. male who presents to the Emergency Department complaining of gradual onset, waxing and waning fever due to abscess tooth onset 1 week. He reports abscess in right upper molar region. He states his He has been running a fever for 1 week with a Tmax of 102.5. Pt has not been able to take Ibuprofen or more than 3 days of Penicillin per jail policy. He reports spider bite in his groin region that has drained and is smaller when he awoke this morning. Pt denies cough, vomiting, diarrhea.   Past Medical History:  Diagnosis Date  . Chronic shoulder pain   . Polysubstance abuse    cocaine, opiates, benzos  . Rhabdomyolysis 2011   accutane induced    There are no active problems to display for this patient.   Past Surgical History:  Procedure Laterality Date  . ORIF PROXIMAL HUMERUS FRACTURE  05/2014    Home Medications    Prior to Admission medications   Not on File    Family History No family history on file.  Social History Social History  Substance Use Topics  . Smoking status: Never Smoker  . Smokeless tobacco: Never Used  . Alcohol use No     Allergies   Review of patient's allergies indicates no known allergies.   Review of Systems Review of Systems  Constitutional: Positive for fever.  A complete 10 system review of systems was obtained and all systems are negative except as noted in the HPI and PMH.   Physical Exam Updated Vital Signs BP  130/79 (BP Location: Left Arm)   Pulse 109   Temp 100.9 F (38.3 C) (Oral)   Resp 18   Ht 5\' 7"  (1.702 m)   Wt 142 lb (64.4 kg)   SpO2 97%   BMI 22.24 kg/m   Physical Exam  Constitutional: He is oriented to person, place, and time. He appears well-developed and well-nourished. No distress.  HENT:  Head: Normocephalic and atraumatic.  Mouth/Throat: Oropharynx is clear and moist. Dental abscesses present. No oropharyngeal exudate.  Left upper rear molar is heavily decayed with surrounding erythema  Eyes: Conjunctivae and EOM are normal. Pupils are equal, round, and reactive to light.  Neck: Normal range of motion. Neck supple.  No meningismus.  Cardiovascular: Normal rate, regular rhythm, normal heart sounds and intact distal pulses.   No murmur heard. Pulmonary/Chest: Effort normal and breath sounds normal. No respiratory distress.  Abdominal: Soft. There is no tenderness. There is no rebound and no guarding.  Musculoskeletal: Normal range of motion. He exhibits no edema or tenderness.  Neurological: He is alert and oriented to person, place, and time. No cranial nerve deficit. He exhibits normal muscle tone. Coordination normal.  No ataxia on finger to nose bilaterally. No pronator drift. 5/5 strength throughout. CN 2-12 intact.Equal grip strength. Sensation intact.   Skin: Skin is warm.  There is a 1 cm, round, indurated area to the left inguinal  region. There is slight drainage from the center.  Psychiatric: He has a normal mood and affect. His behavior is normal.  Nursing note and vitals reviewed.    ED Treatments / Results  DIAGNOSTIC STUDIES: Oxygen Saturation is 97% on RA, adequate by my interpretation.    COORDINATION OF CARE: 9:26 PM Discussed treatment plan with pt at bedside which includes Ibuprofen and Clindamycin and pt agreed to plan.  Labs (all labs ordered are listed, but only abnormal results are displayed) Labs Reviewed - No data to display  EKG  EKG  Interpretation None       Radiology No results found.  Procedures Procedures (including critical care time)  Medications Ordered in ED Medications - No data to display   Initial Impression / Assessment and Plan / ED Course  I have reviewed the triage vital signs and the nursing notes.  Pertinent labs & imaging results that were available during my care of the patient were reviewed by me and considered in my medical decision making (see chart for details).  Clinical Course  He will be treated with clindamycin for possible MRSA abscess in the left groin and also what appears to be a dental infection. There is no evidence for stridor, airway compromise, or Ludwig angina. I will also recommend Tylenol rotating with ibuprofen as needed for pain or fever.   I personally performed the services described in this documentation, which was scribed in my presence. The recorded information has been reviewed and is accurate.    Final Clinical Impressions(s) / ED Diagnoses   Final diagnoses:  None    New Prescriptions New Prescriptions   No medications on file     Geoffery Lyonsouglas Yahya Boldman, MD 02/27/16 2356

## 2016-02-27 NOTE — ED Notes (Signed)
[facility]  Number given by Prison guard [phone].

## 2016-02-27 NOTE — ED Notes (Signed)
[facility]

## 2016-02-27 NOTE — Discharge Instructions (Signed)
Clindamycin as prescribed.  Tylenol 1000 mg rotated with ibuprofen 600 mg every 3 hours as needed for pain or fever.  Return to the emergency department if symptoms significantly worsen or change.

## 2016-04-03 ENCOUNTER — Encounter (HOSPITAL_COMMUNITY): Payer: Self-pay | Admitting: *Deleted

## 2016-04-03 ENCOUNTER — Emergency Department (HOSPITAL_COMMUNITY)
Admission: EM | Admit: 2016-04-03 | Discharge: 2016-04-03 | Disposition: A | Discharge status: Home / Self Care | Payer: Self-pay | Attending: Dermatology | Admitting: Dermatology

## 2016-04-03 DIAGNOSIS — Z5321 Procedure and treatment not carried out due to patient leaving prior to being seen by health care provider: Secondary | ICD-10-CM | POA: Insufficient documentation

## 2016-04-03 DIAGNOSIS — T401X1A Poisoning by heroin, accidental (unintentional), initial encounter: Secondary | ICD-10-CM | POA: Insufficient documentation

## 2016-04-03 NOTE — ED Notes (Signed)
Pt wanting to know when he can leave. Will notify MD and Inetta Fermoina, charge nurse.

## 2016-04-03 NOTE — ED Triage Notes (Signed)
Pt brought in by [facility] for c/o overdose; ems reports pt was found unresponsive by family after using heroin; pt was given 2 doses of narcan; pt arrived to ED ambulatory, alerted and oriented x 4; pt states he hasn't used in the last 6-8 months

## 2016-04-03 NOTE — ED Notes (Signed)
Dr Clarene DukeMcManus informed that pt wanted to leave. Pt is alert and oriented x 4, ambulatory and aware of risks of leaving AMA.

## 2017-02-16 IMAGING — DX DG CHEST 2V
2 series · 2 of 2 positions shown · non-contrast
Comparison: September 23, 2010

CLINICAL DATA: Fever with 1 month history of hemoptysis

EXAM:
CHEST  2 VIEW

[chest pa]
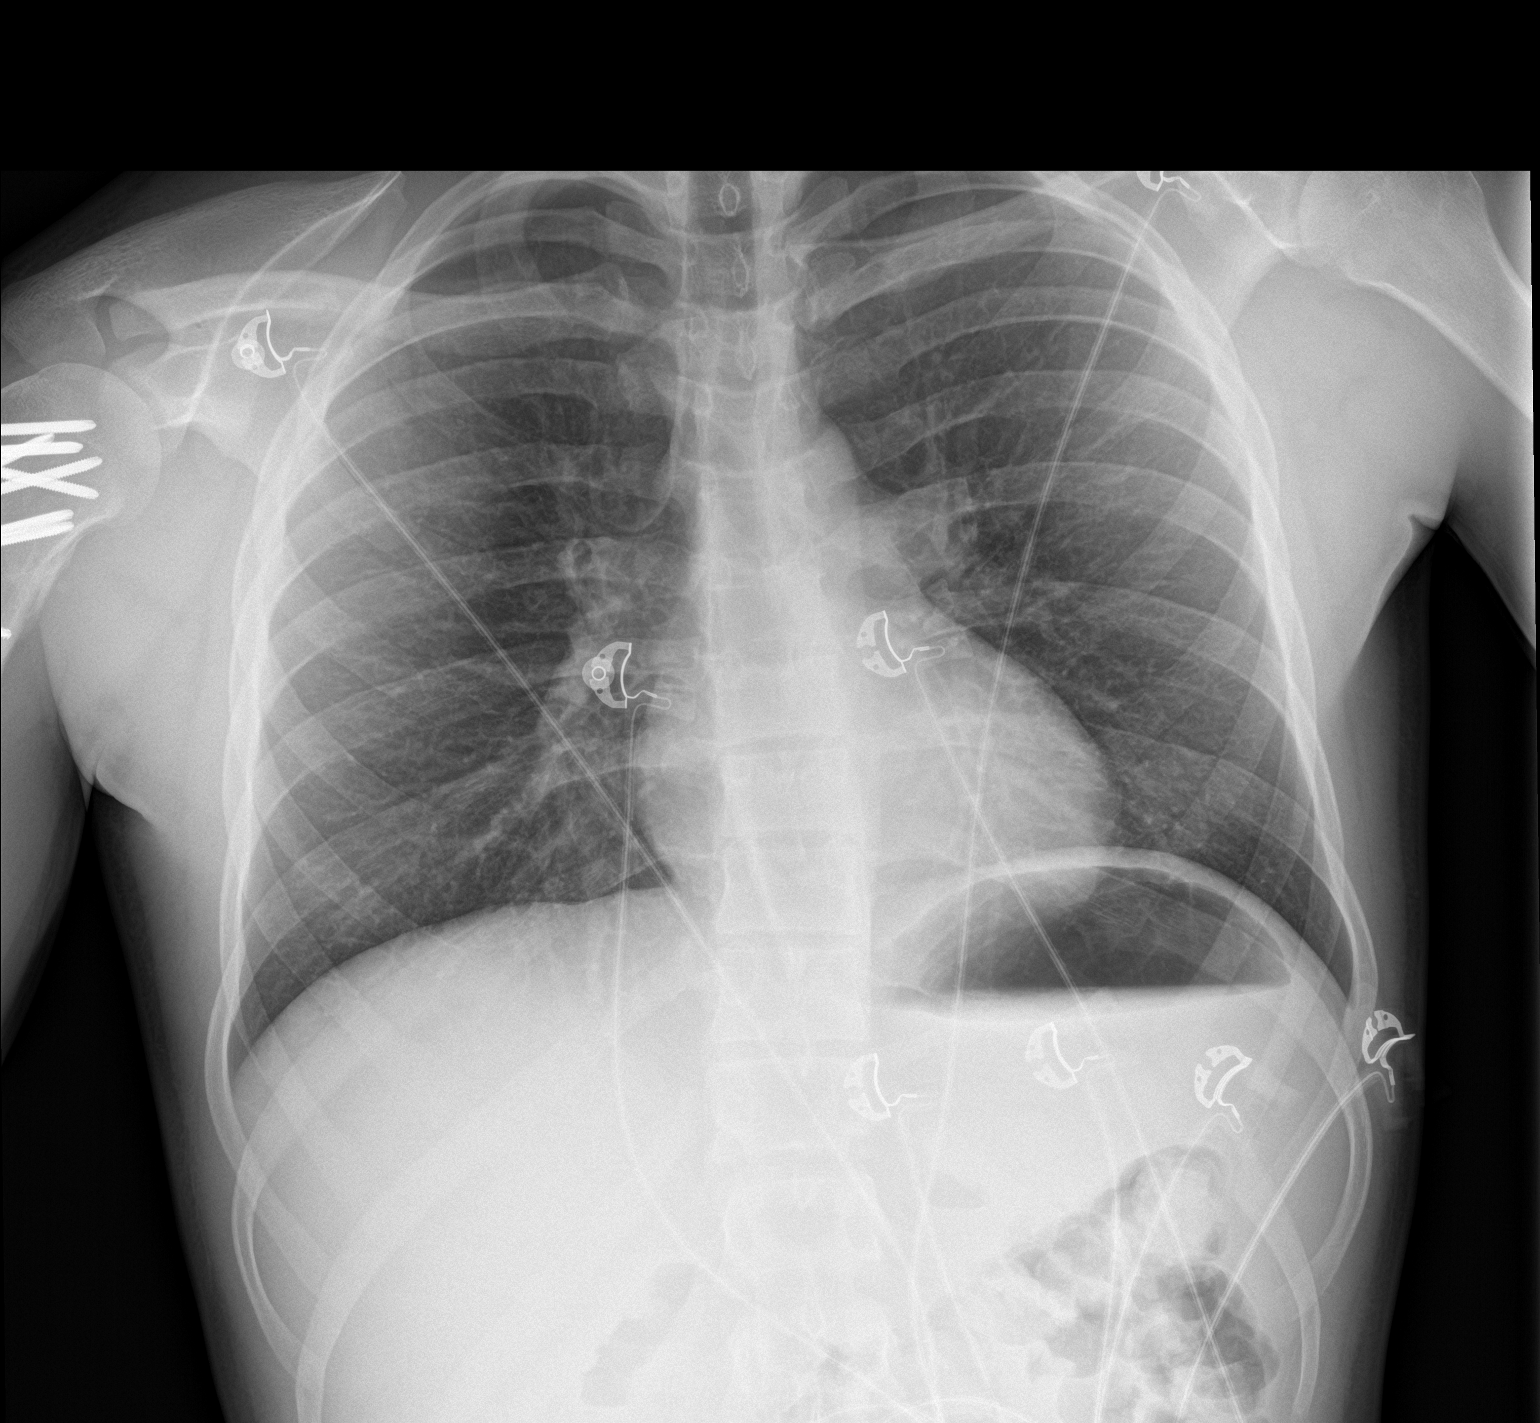

[chest lat]
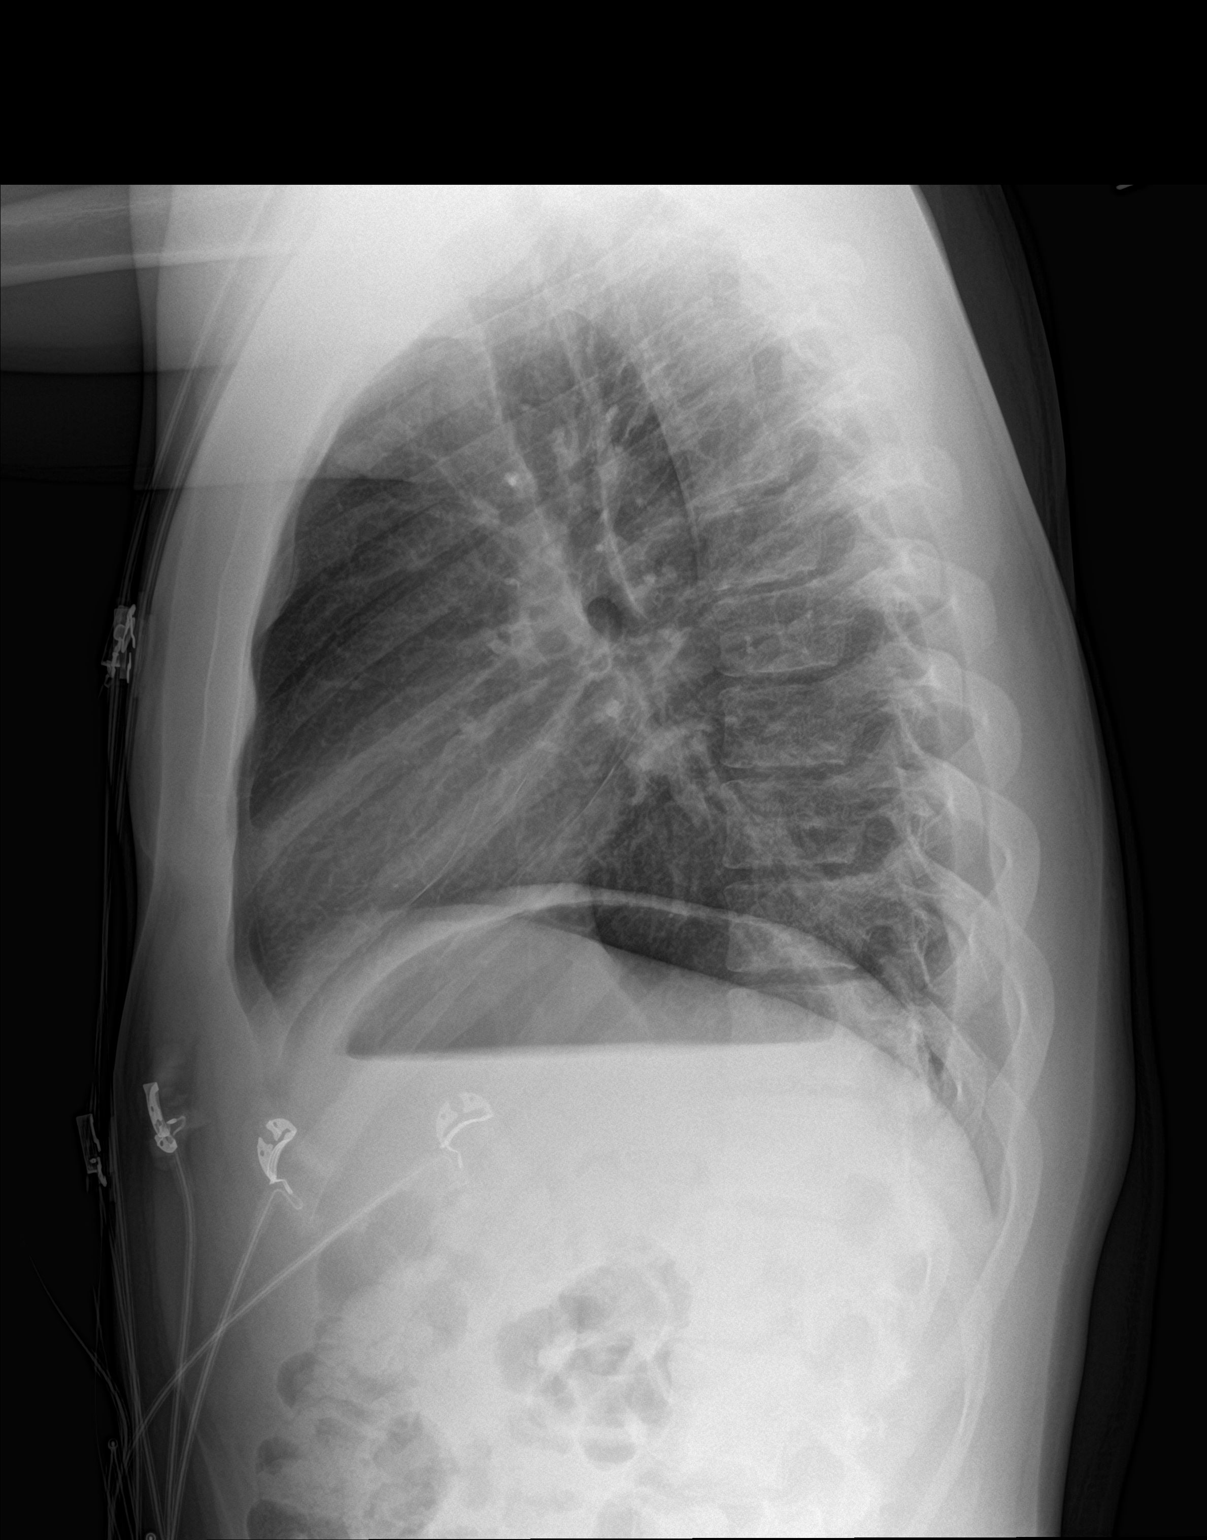

[2 of 2 positions shown; findings below may reference images not displayed]

FINDINGS: Lungs are clear. Heart size and pulmonary vascularity are normal. No
adenopathy. There is postoperative change in the right proximal
humerus.
IMPRESSION: No edema or consolidation.

## 2017-07-06 ENCOUNTER — Emergency Department (HOSPITAL_COMMUNITY)
Admission: EM | Admit: 2017-07-06 | Discharge: 2017-07-06 | Disposition: A | Discharge status: Home / Self Care | Payer: Self-pay | Attending: Emergency Medicine | Admitting: Emergency Medicine

## 2017-07-06 ENCOUNTER — Encounter (HOSPITAL_COMMUNITY): Payer: Self-pay | Admitting: Emergency Medicine

## 2017-07-06 ENCOUNTER — Other Ambulatory Visit: Payer: Self-pay

## 2017-07-06 ENCOUNTER — Emergency Department (HOSPITAL_COMMUNITY): Payer: Self-pay

## 2017-07-06 DIAGNOSIS — Z87891 Personal history of nicotine dependence: Secondary | ICD-10-CM | POA: Insufficient documentation

## 2017-07-06 DIAGNOSIS — R042 Hemoptysis: Secondary | ICD-10-CM | POA: Insufficient documentation

## 2017-07-06 LAB — CBC WITH DIFFERENTIAL/PLATELET
BASOS ABS: 0 10*3/uL (ref 0.0–0.1)
BASOS PCT: 0 %
EOS ABS: 0.2 10*3/uL (ref 0.0–0.7)
Eosinophils Relative: 2 %
HCT: 45.1 % (ref 39.0–52.0)
Hemoglobin: 15.2 g/dL (ref 13.0–17.0)
LYMPHS PCT: 45 %
Lymphs Abs: 5.4 10*3/uL — ABNORMAL HIGH (ref 0.7–4.0)
MCH: 28.7 pg (ref 26.0–34.0)
MCHC: 33.7 g/dL (ref 30.0–36.0)
MCV: 85.1 fL (ref 78.0–100.0)
MONO ABS: 0.7 10*3/uL (ref 0.1–1.0)
Monocytes Relative: 6 %
Neutro Abs: 5.5 10*3/uL (ref 1.7–7.7)
Neutrophils Relative %: 47 %
PLATELETS: 252 10*3/uL (ref 150–400)
RBC: 5.3 MIL/uL (ref 4.22–5.81)
RDW: 12.8 % (ref 11.5–15.5)
WBC: 11.9 10*3/uL — ABNORMAL HIGH (ref 4.0–10.5)

## 2017-07-06 LAB — COMPREHENSIVE METABOLIC PANEL
ALBUMIN: 4.8 g/dL (ref 3.5–5.0)
ALT: 26 U/L (ref 17–63)
AST: 23 U/L (ref 15–41)
Alkaline Phosphatase: 84 U/L (ref 38–126)
Anion gap: 8 (ref 5–15)
BUN: 18 mg/dL (ref 6–20)
CHLORIDE: 103 mmol/L (ref 101–111)
CO2: 27 mmol/L (ref 22–32)
Calcium: 9.6 mg/dL (ref 8.9–10.3)
Creatinine, Ser: 1.03 mg/dL (ref 0.61–1.24)
GFR calc Af Amer: >60 mL/min (ref >60)
Glucose, Bld: 153 mg/dL — ABNORMAL HIGH (ref 65–99)
POTASSIUM: 4 mmol/L (ref 3.5–5.1)
Sodium: 138 mmol/L (ref 135–145)
Total Bilirubin: 0.5 mg/dL (ref 0.3–1.2)
Total Protein: 7.7 g/dL (ref 6.5–8.1)

## 2017-07-06 NOTE — ED Notes (Signed)
Pt states he needs to pick his daughter up in an hour and if we "aren't going to be done by then, he wants a work note and he will come back tomorrow".

## 2017-07-06 NOTE — ED Triage Notes (Addendum)
Patient c/o blood in sputum. Per patient hemoptysis x3 days. Patient states "I've coughed up a [employer] cup full of blood." Patient reports bilateral rib pain. Denies any fevers. Patient concerned due to working around certain chemicals at work without mask. Patient reports some nausea.

## 2017-07-06 NOTE — ED Provider Notes (Signed)
[facility]   CSN: [id number] Arrival date & time: 07/06/17  1828     History   Chief Complaint Chief Complaint  Patient presents with  . Hemoptysis    HPI Brad Todd is a 27 y.o. male.  Patient states that several months ago he had a period of where he was coughing up blood.  Restarted about 3-4 days ago.  Patient states she is coughing up a cup a day.  Is worried about some exposure to chemicals at work.  Patient denies any other symptoms no lightheadedness no passing out.  No fevers.  No chest pain no shortness of breath.  States that it is mostly blood.  Talks about some fibrous material coming up with the blood.      Past Medical History:  Diagnosis Date  . Chronic shoulder pain   . Polysubstance abuse (HCC)    cocaine, opiates, benzos  . Rhabdomyolysis 2011   accutane induced    There are no active problems to display for this patient.   Past Surgical History:  Procedure Laterality Date  . ORIF PROXIMAL HUMERUS FRACTURE  05/2014       Home Medications    Prior to Admission medications   Medication Sig Start Date End Date Taking? Authorizing Provider  acetaminophen (TYLENOL) 500 MG tablet Take 2 tablets (1,000 mg total) by mouth every 6 (six) hours as needed. 02/27/16   Geoffery Lyonselo, Douglas, MD  clindamycin (CLEOCIN) 300 MG capsule Take 1 capsule (300 mg total) by mouth 4 (four) times daily. X 7 days 02/27/16   Geoffery Lyonselo, Douglas, MD  ibuprofen (ADVIL,MOTRIN) 600 MG tablet Take 1 tablet (600 mg total) by mouth every 6 (six) hours as needed. 02/27/16   Geoffery Lyonselo, Douglas, MD    Family History No family history on file.  Social History Social History   Tobacco Use  . Smoking status: Former Smoker    Types: Cigarettes  . Smokeless tobacco: Never Used  Substance Use Topics  . Alcohol use: No    Comment: occasionl  . Drug use: No     Allergies   Patient has no known allergies.   Review of Systems Review of Systems    Constitutional: Negative for fatigue and fever.  HENT: Negative for congestion, sore throat and trouble swallowing.   Eyes: Negative for redness.  Respiratory: Negative for shortness of breath.   Cardiovascular: Negative for chest pain.  Gastrointestinal: Negative for abdominal pain, diarrhea, nausea and vomiting.  Genitourinary: Negative for hematuria.  Musculoskeletal: Negative for back pain.  Skin: Negative for rash.  Neurological: Negative for syncope, light-headedness and headaches.  Hematological: Does not bruise/bleed easily.  Psychiatric/Behavioral: Negative for confusion.     Physical Exam Updated Vital Signs BP 122/88 (BP Location: Left Arm)   Pulse (!) 109   Temp 98.1 F (36.7 C) (Oral)   Resp 18   Ht 1.727 m (5\' 8" )   Wt 74.8 kg (165 lb)   SpO2 100%   BMI 25.09 kg/m   Physical Exam  Constitutional: He is oriented to person, place, and time. He appears well-developed and well-nourished. No distress.  HENT:  Head: Normocephalic and atraumatic.  Mouth/Throat: Oropharynx is clear and moist. No oropharyngeal exudate.  Oropharynx clear uvula midline no swelling no evidence of any posterior pharynx blood.  Eyes: Conjunctivae and EOM are normal. Pupils are equal, round, and reactive to light.  Neck: Normal range of motion. Neck supple.  Cardiovascular: Normal rate, regular rhythm and normal  heart sounds.  Pulmonary/Chest: Effort normal and breath sounds normal. No respiratory distress. He has no wheezes. He has no rales.  Abdominal: Soft. Bowel sounds are normal. There is no tenderness.  Musculoskeletal: Normal range of motion.  Neurological: He is alert and oriented to person, place, and time. No cranial nerve deficit or sensory deficit. He exhibits normal muscle tone. Coordination normal.  Skin: Skin is warm. No rash noted.  Nursing note and vitals reviewed.    ED Treatments / Results  Labs (all labs ordered are listed, but only abnormal results are  displayed) Labs Reviewed  CBC WITH DIFFERENTIAL/PLATELET - Abnormal; Notable for the following components:      Result Value   WBC 11.9 (*)    Lymphs Abs 5.4 (*)    All other components within normal limits  COMPREHENSIVE METABOLIC PANEL - Abnormal; Notable for the following components:   Glucose, Bld 153 (*)    All other components within normal limits    EKG  EKG Interpretation None       Radiology Dg Chest 2 View  Result Date: 07/06/2017 CLINICAL DATA:  Intermittent hemoptysis EXAM: CHEST  2 VIEW COMPARISON:  06/03/2017 FINDINGS: The lungs are clear without focal pneumonia, edema, pneumothorax or pleural effusion. The cardiopericardial silhouette is within normal limits for size. The visualized bony structures of the thorax are intact. Patient is status post ORIF of the right humerus, incompletely visualized. IMPRESSION: No active cardiopulmonary disease. Electronically Signed   By: Kennith Center M.D.   On: 07/06/2017 19:33    Procedures Procedures (including critical care time)  Medications Ordered in ED Medications - No data to display   Initial Impression / Assessment and Plan / ED Course  I have reviewed the triage vital signs and the nursing notes.  Pertinent labs & imaging results that were available during my care of the patient were reviewed by me and considered in my medical decision making (see chart for details).    Workup hemoglobin hematocrit without evidence of any significant blood loss.  Mild leukocytosis.  Lungs clear bilaterally chest x-ray negative.  Oxygen saturations in upper 90s most recent 100%.  Slightly tachycardic with heart rates of 109 115.  Patient without any chest pain or shortness of breath but due to the history of hemoptysis negative chest x-ray recommended CT angios of the chest.  Patient stated that he needed to pick his daughter up and could not stay.  Patient refused to have a CT angios.  Clinically not critical that he have it done today  states he will return and get it done later.  Patient understands the importance of it.  Patient will be discharged home.  In addition due to the possible work exposure to chemicals liver function tests are also normal.   Final Clinical Impressions(s) / ED Diagnoses   Final diagnoses:  Hemoptysis    ED Discharge Orders    None       Vanetta Mulders, MD 07/06/17 2039

## 2017-07-06 NOTE — Discharge Instructions (Signed)
At recommended CT angios due to the hemoptysis.  If you change your mind about wanting that to be done you can return.  Today's labs without significant abnormality.  Chest x-ray negative.  Work note provided.

## 2017-07-28 ENCOUNTER — Encounter (HOSPITAL_COMMUNITY): Payer: Self-pay | Admitting: *Deleted

## 2017-07-28 ENCOUNTER — Emergency Department (HOSPITAL_COMMUNITY)
Admission: EM | Admit: 2017-07-28 | Discharge: 2017-07-29 | Discharge status: Against Medical Advice | Payer: Self-pay | Attending: Emergency Medicine | Admitting: Emergency Medicine

## 2017-07-28 ENCOUNTER — Other Ambulatory Visit: Payer: Self-pay

## 2017-07-28 DIAGNOSIS — Z87891 Personal history of nicotine dependence: Secondary | ICD-10-CM | POA: Insufficient documentation

## 2017-07-28 DIAGNOSIS — T40601A Poisoning by unspecified narcotics, accidental (unintentional), initial encounter: Secondary | ICD-10-CM

## 2017-07-28 DIAGNOSIS — T402X1A Poisoning by other opioids, accidental (unintentional), initial encounter: Secondary | ICD-10-CM | POA: Insufficient documentation

## 2017-07-28 NOTE — ED Provider Notes (Signed)
[facility] EMERGENCY DEPARTMENT Provider Note   CSN: [id number] Arrival date & time: 07/28/17  2327     History   Chief Complaint Chief Complaint  Patient presents with  . Drug Overdose    HPI Brad Todd is a 27 y.o. male.  Patient presents to the emergency department for evaluation of unintentional overdose.  Patient reports that he has been clean for quite some time, but used tonight.  Girlfriend was unable to arouse him.  First responders to the scene report that he was apneic, administered Narcan.  He had only partial response to IM Narcan, EMS established an IV and gave an additional 2 mg IV at which time the patient became awake and alert.  He admits to using, was not intending to overdose or trying to harm himself.      Past Medical History:  Diagnosis Date  . Chronic shoulder pain   . Polysubstance abuse (HCC)    cocaine, opiates, benzos  . Rhabdomyolysis 2011   accutane induced    There are no active problems to display for this patient.   Past Surgical History:  Procedure Laterality Date  . ORIF PROXIMAL HUMERUS FRACTURE  05/2014       Home Medications    Prior to Admission medications   Medication Sig Start Date End Date Taking? Authorizing Provider  acetaminophen (TYLENOL) 500 MG tablet Take 2 tablets (1,000 mg total) by mouth every 6 (six) hours as needed. 02/27/16   Geoffery Lyons, MD  clindamycin (CLEOCIN) 300 MG capsule Take 1 capsule (300 mg total) by mouth 4 (four) times daily. X 7 days 02/27/16   Geoffery Lyons, MD  ibuprofen (ADVIL,MOTRIN) 600 MG tablet Take 1 tablet (600 mg total) by mouth every 6 (six) hours as needed. 02/27/16   Geoffery Lyons, MD    Family History No family history on file.  Social History Social History   Tobacco Use  . Smoking status: Former Smoker    Types: Cigarettes  . Smokeless tobacco: Never Used  Substance Use Topics  . Alcohol use: No    Comment: occasionl  . Drug use: Yes    Types: IV    Comment:  heroin     Allergies   Patient has no known allergies.   Review of Systems Review of Systems  Psychiatric/Behavioral: Negative for suicidal ideas.  All other systems reviewed and are negative.    Physical Exam Updated Vital Signs BP 132/85 (BP Location: Right Arm)   Pulse (!) 103   Temp 98.1 F (36.7 C) (Oral)   Resp 13   Ht 5\' 7"  (1.702 m)   Wt 68 kg (150 lb)   SpO2 96%   BMI 23.49 kg/m   Physical Exam  Constitutional: He is oriented to person, place, and time. He appears well-developed and well-nourished. No distress.  HENT:  Head: Normocephalic and atraumatic.  Right Ear: Hearing normal.  Left Ear: Hearing normal.  Nose: Nose normal.  Mouth/Throat: Oropharynx is clear and moist and mucous membranes are normal.  Eyes: Conjunctivae and EOM are normal. Pupils are equal, round, and reactive to light.  Neck: Normal range of motion. Neck supple.  Cardiovascular: Regular rhythm, S1 normal and S2 normal. Exam reveals no gallop and no friction rub.  No murmur heard. Pulmonary/Chest: Effort normal and breath sounds normal. No respiratory distress. He exhibits no tenderness.  Abdominal: Soft. Normal appearance and bowel sounds are normal. There is no hepatosplenomegaly. There is no tenderness. There is no rebound, no guarding,  no tenderness at McBurney's point and negative Murphy's sign. No hernia.  Musculoskeletal: Normal range of motion.  Neurological: He is alert and oriented to person, place, and time. He has normal strength. No cranial nerve deficit or sensory deficit. Coordination normal. GCS eye subscore is 4. GCS verbal subscore is 5. GCS motor subscore is 6.  Skin: Skin is warm, dry and intact. No rash noted. No cyanosis.  Multiple abrasions on upper arms  Psychiatric: He has a normal mood and affect. His speech is normal and behavior is normal. Thought content normal.  Nursing note and vitals reviewed.    ED Treatments / Results  Labs (all labs ordered are  listed, but only abnormal results are displayed) Labs Reviewed - No data to display  EKG  EKG Interpretation None       Radiology No results found.  Procedures Procedures (including critical care time)  Medications Ordered in ED Medications - No data to display   Initial Impression / Assessment and Plan / ED Course  I have reviewed the triage vital signs and the nursing notes.  Pertinent labs & imaging results that were available during my care of the patient were reviewed by me and considered in my medical decision making (see chart for details).     Patient brought to the emergency department for evaluation of unintentional opioid overdose.  Patient has a known history of polysubstance abuse.  He admits to relapsing.  Patient found unresponsive but had complete recovery after Narcan.  He was monitored for a period of time here in the ER and continued to do well.  At some point during the observation, patient eloped.  Final Clinical Impressions(s) / ED Diagnoses   Final diagnoses:  Opiate overdose, accidental or unintentional, initial encounter [facility](HCC)    ED Discharge Orders    None       Macil Crady, Canary Brimhristopher J, MD 07/29/17 [phone]

## 2017-07-28 NOTE — ED Triage Notes (Signed)
   Per [facility], they were called for a possible drug overdose. Girlfriend was administering CPR on the side of the road when [facility] showed up. Pt denies drug use and states he has been clean x 2 months as he was an iv heroin user. [employer] on scene as well and they are very familiar with this pt and they administered 4 mg of Narcan. Pt's respirations were 4 after 4 of Narcan.

## 2017-07-29 NOTE — ED Notes (Signed)
Pt leaving ama. This nurse explained risk of leaving ama. Pt verbalized understanding of risk. MD notified.

## 2017-09-01 ENCOUNTER — Encounter (HOSPITAL_COMMUNITY): Payer: Self-pay | Admitting: Emergency Medicine

## 2017-09-01 ENCOUNTER — Emergency Department (HOSPITAL_COMMUNITY)
Admission: EM | Admit: 2017-09-01 | Discharge: 2017-09-01 | Disposition: A | Discharge status: Home / Self Care | Payer: Self-pay | Attending: Emergency Medicine | Admitting: Emergency Medicine

## 2017-09-01 ENCOUNTER — Emergency Department (HOSPITAL_COMMUNITY): Payer: Self-pay

## 2017-09-01 DIAGNOSIS — Z113 Encounter for screening for infections with a predominantly sexual mode of transmission: Secondary | ICD-10-CM | POA: Insufficient documentation

## 2017-09-01 DIAGNOSIS — Z79899 Other long term (current) drug therapy: Secondary | ICD-10-CM | POA: Insufficient documentation

## 2017-09-01 DIAGNOSIS — R062 Wheezing: Secondary | ICD-10-CM | POA: Insufficient documentation

## 2017-09-01 DIAGNOSIS — N342 Other urethritis: Secondary | ICD-10-CM | POA: Insufficient documentation

## 2017-09-01 DIAGNOSIS — R042 Hemoptysis: Secondary | ICD-10-CM | POA: Insufficient documentation

## 2017-09-01 DIAGNOSIS — F17210 Nicotine dependence, cigarettes, uncomplicated: Secondary | ICD-10-CM | POA: Insufficient documentation

## 2017-09-01 LAB — COMPREHENSIVE METABOLIC PANEL
ALK PHOS: 73 U/L (ref 38–126)
ALT: 28 U/L (ref 17–63)
AST: 23 U/L (ref 15–41)
Albumin: 4.1 g/dL (ref 3.5–5.0)
Anion gap: 7 (ref 5–15)
BUN: 11 mg/dL (ref 6–20)
CO2: 28 mmol/L (ref 22–32)
CREATININE: 0.84 mg/dL (ref 0.61–1.24)
Calcium: 9.2 mg/dL (ref 8.9–10.3)
Chloride: 104 mmol/L (ref 101–111)
GFR calc non Af Amer: >60 mL/min (ref >60)
Glucose, Bld: 100 mg/dL — ABNORMAL HIGH (ref 65–99)
Potassium: 3.7 mmol/L (ref 3.5–5.1)
SODIUM: 139 mmol/L (ref 135–145)
TOTAL PROTEIN: 7 g/dL (ref 6.5–8.1)
Total Bilirubin: 0.5 mg/dL (ref 0.3–1.2)

## 2017-09-01 LAB — RAPID URINE DRUG SCREEN, HOSP PERFORMED
Amphetamines: NOT DETECTED
Barbiturates: NOT DETECTED
Benzodiazepines: POSITIVE — AB
COCAINE: NOT DETECTED
OPIATES: POSITIVE — AB
Tetrahydrocannabinol: NOT DETECTED

## 2017-09-01 LAB — URINALYSIS, ROUTINE W REFLEX MICROSCOPIC
BILIRUBIN URINE: NEGATIVE
Glucose, UA: NEGATIVE mg/dL
Hgb urine dipstick: NEGATIVE
KETONES UR: NEGATIVE mg/dL
Nitrite: NEGATIVE
Protein, ur: NEGATIVE mg/dL
SPECIFIC GRAVITY, URINE: 1.019 (ref 1.005–1.030)
SQUAMOUS EPITHELIAL / LPF: NONE SEEN
pH: 5 (ref 5.0–8.0)

## 2017-09-01 LAB — CBC WITH DIFFERENTIAL/PLATELET
Basophils Absolute: 0 10*3/uL (ref 0.0–0.1)
Basophils Relative: 0 %
EOS ABS: 0.4 10*3/uL (ref 0.0–0.7)
Eosinophils Relative: 3 %
HCT: 42.4 % (ref 39.0–52.0)
Hemoglobin: 13.7 g/dL (ref 13.0–17.0)
LYMPHS ABS: 4.4 10*3/uL — AB (ref 0.7–4.0)
LYMPHS PCT: 36 %
MCH: 28.2 pg (ref 26.0–34.0)
MCHC: 32.3 g/dL (ref 30.0–36.0)
MCV: 87.4 fL (ref 78.0–100.0)
MONOS PCT: 7 %
Monocytes Absolute: 0.9 10*3/uL (ref 0.1–1.0)
NEUTROS ABS: 6.6 10*3/uL (ref 1.7–7.7)
Neutrophils Relative %: 54 %
Platelets: 258 10*3/uL (ref 150–400)
RBC: 4.85 MIL/uL (ref 4.22–5.81)
RDW: 13.4 % (ref 11.5–15.5)
WBC: 12.3 10*3/uL — AB (ref 4.0–10.5)

## 2017-09-01 LAB — PROTIME-INR
INR: 1
Prothrombin Time: 13.1 seconds (ref 11.4–15.2)

## 2017-09-01 MED ORDER — ACETAMINOPHEN 325 MG PO TABS
650.0000 mg | ORAL_TABLET | Freq: Once | ORAL | Status: AC
  Administered 2017-09-01: 650 mg via ORAL
  Filled 2017-09-01: qty 2

## 2017-09-01 MED ORDER — AZITHROMYCIN 250 MG PO TABS
1000.0000 mg | ORAL_TABLET | Freq: Once | ORAL | Status: AC
  Administered 2017-09-01: 1000 mg via ORAL
  Filled 2017-09-01: qty 4

## 2017-09-01 MED ORDER — LIDOCAINE HCL (PF) 1 % IJ SOLN
INTRAMUSCULAR | Status: AC
  Administered 2017-09-01: 2 mL
  Filled 2017-09-01: qty 2

## 2017-09-01 MED ORDER — IPRATROPIUM-ALBUTEROL 0.5-2.5 (3) MG/3ML IN SOLN
3.0000 mL | Freq: Once | RESPIRATORY_TRACT | Status: AC
  Administered 2017-09-01: 3 mL via RESPIRATORY_TRACT
  Filled 2017-09-01: qty 3

## 2017-09-01 MED ORDER — CEFTRIAXONE SODIUM 250 MG IJ SOLR
250.0000 mg | Freq: Once | INTRAMUSCULAR | Status: AC
  Administered 2017-09-01: 250 mg via INTRAMUSCULAR
  Filled 2017-09-01: qty 250

## 2017-09-01 MED ORDER — IOPAMIDOL (ISOVUE-370) INJECTION 76%
100.0000 mL | Freq: Once | INTRAVENOUS | Status: AC | PRN
  Administered 2017-09-01: 100 mL via INTRAVENOUS

## 2017-09-01 MED ORDER — ALBUTEROL SULFATE HFA 108 (90 BASE) MCG/ACT IN AERS
2.0000 | INHALATION_SPRAY | RESPIRATORY_TRACT | Status: DC | PRN
  Administered 2017-09-01: 2 via RESPIRATORY_TRACT
  Filled 2017-09-01: qty 6.7

## 2017-09-01 NOTE — ED Triage Notes (Signed)
Pt reports coughing up blood for over a year but worsening the past week.  Also states he has been having dysuria with green penile discharge for a week after having sex with new partner.

## 2017-09-01 NOTE — Discharge Instructions (Addendum)
Use the inhaler given as instructed taking 2 puffs every 4 hours if you are coughing or wheezing.  You have been treated for both gonorrhea and chlamydia today with the medications he received here.  It will be several days before your cultures result and you will be notified if either is positive.  You will need to inform your partner if either culture is positive so she can be treated for these infections.  Avoid intercourse for the next week, or longer if your symptoms persist.   [facility]  [address] [place], [place] [address] [phone]  Services [facility] offers a variety of basic health services.  Services include but are not limited to: Blood pressure checks  Heart rate checks  Blood sugar checks  Urine analysis  Rapid strep tests  Pregnancy tests.  Health education and referrals  People needing more complex services will be directed to a physician online. Using these virtual visits, doctors can evaluate and prescribe medicine and treatments. There will be no medication on-site, though [facility] will help patients fill their prescriptions at little to no cost.   For More information please go to: [email or web address]

## 2017-09-01 NOTE — ED Provider Notes (Signed)
[facility] EMERGENCY DEPARTMENT Provider Note   CSN: [id number] Arrival date & time: 09/01/17  4098     History   Chief Complaint Chief Complaint  Patient presents with  . Hemoptysis  . SEXUALLY TRANSMITTED DISEASE    HPI Brad Todd is a 27 y.o. male with multiple complaints.  The first being an acute on chronic (x 1 year) of hemoptysis, reporting bright red to brown blood which has been heavy again for the past week in association with fever with tmax 104 -6 days ago, not measured since but has felt feverish. He will also cough up thick material that looks like blood vessels.  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 He is currently traveling with his job across the country Program[employer], [employer].   He reports smoking 2 cigarettes a day, used to smoke 1ppd.  He reports chronic sob with wheezing.  He denies unusual bruising, nose bleeds or gum bleeding. No Family hx of bleeding disorders.  Has a history of cocaine abuse, denies current use. He has chronic right shoulder pain from a reconstructive surgery and takes ibuprofen 600 mg tid and tylenol 1 gram tid daily for this chronic pain.   Also with complaint of penile discharge and dysuria x the past several days after unprotected intercourse with a new partner.  Denies abdominal pain, n/v back pain.   The history is provided by the patient.    Past Medical History:  Diagnosis Date  . Chronic shoulder pain   . Polysubstance abuse (HCC)    cocaine, opiates, benzos  . Rhabdomyolysis 2011   accutane induced    There are no active problems to display for this patient.   Past Surgical History:  Procedure Laterality Date  . ORIF PROXIMAL HUMERUS FRACTURE  05/2014        Home Medications    Prior to Admission medications   Medication Sig Start Date End Date Taking? Authorizing  Provider  acetaminophen (TYLENOL) 500 MG tablet Take 2 tablets (1,000 mg total) by mouth every 6 (six) hours as needed. 02/27/16   Geoffery Lyons, MD  clindamycin (CLEOCIN) 300 MG capsule Take 1 capsule (300 mg total) by mouth 4 (four) times daily. X 7 days 02/27/16   Geoffery Lyons, MD  ibuprofen (ADVIL,MOTRIN) 600 MG tablet Take 1 tablet (600 mg total) by mouth every 6 (six) hours as needed. 02/27/16   Geoffery Lyons, MD    Family History History reviewed. No pertinent family history.  Social History Social History   Tobacco Use  . Smoking status: Current Every Day Smoker    Types: Cigarettes  . Smokeless tobacco: Never Used  Substance Use Topics  . Alcohol use: Yes    Comment: weekly  . Drug use: Not Currently    Types: IV    Comment: heroin     Allergies   Patient has no known allergies.   Review of Systems Review of Systems  Constitutional: Positive for fever.  HENT: Negative for congestion, nosebleeds and sore throat.   Eyes: Negative.   Respiratory: Positive for cough, shortness of breath and wheezing. Negative for chest tightness.        Negative except as mentioned in HPI.   Cardiovascular: Negative for chest pain.  Gastrointestinal: Negative for abdominal pain, nausea and vomiting.  Genitourinary: Positive for discharge and dysuria.  Musculoskeletal: Negative for  arthralgias, joint swelling and neck pain.  Skin: Negative.  Negative for rash and wound.  Neurological: Negative for dizziness, weakness, light-headedness, numbness and headaches.  Psychiatric/Behavioral: Negative.      Physical Exam Updated Vital Signs BP (!) 139/91 (BP Location: Left Arm)   Pulse 89   Temp 98 F (36.7 C) (Oral)   Resp 16   Ht 5\' 9"  (1.753 m)   Wt 73.5 kg (162 lb)   SpO2 97%   BMI 23.92 kg/m   Physical Exam  Constitutional: He appears well-developed and well-nourished.  HENT:  Head: Normocephalic and atraumatic.  Mouth/Throat: Oropharynx is clear and moist.  No blood in  nostrils or posterior pharynx. There is a prominent blood vessel posterior pharyngeal wall but no evidence of recent bleeding.  Eyes: Conjunctivae are normal.  Neck: Normal range of motion.  Cardiovascular: Normal rate, regular rhythm, normal heart sounds and intact distal pulses.  Pulmonary/Chest: Effort normal. No respiratory distress. He has wheezes. He has rhonchi.  Expiratory wheeze throughout all lung fields.  Coarse breath sounds. Pt actively coughing small amounts of brown to red liquid blood.   Abdominal: Soft. Bowel sounds are normal. There is no tenderness.  Musculoskeletal: Normal range of motion.  Neurological: He is alert.  Skin: Skin is warm and dry.  Psychiatric: He has a normal mood and affect.  Nursing note and vitals reviewed.    ED Treatments / Results  Labs (all labs ordered are listed, but only abnormal results are displayed) Labs Reviewed  CBC WITH DIFFERENTIAL/PLATELET - Abnormal; Notable for the following components:      Result Value   WBC 12.3 (*)    Lymphs Abs 4.4 (*)    All other components within normal limits  COMPREHENSIVE METABOLIC PANEL - Abnormal; Notable for the following components:   Glucose, Bld 100 (*)    All other components within normal limits  URINALYSIS, ROUTINE W REFLEX MICROSCOPIC - Abnormal; Notable for the following components:   APPearance HAZY (*)    Leukocytes, UA LARGE (*)    Bacteria, UA RARE (*)    All other components within normal limits  RAPID URINE DRUG SCREEN, HOSP PERFORMED - Abnormal; Notable for the following components:   Opiates POSITIVE (*)    Benzodiazepines POSITIVE (*)    All other components within normal limits  PROTIME-INR  HIV ANTIBODY (ROUTINE TESTING)  GC/CHLAMYDIA PROBE AMP ([place]) NOT [facility]    EKG None  Radiology Dg Chest 2 View  Result Date: 09/01/2017 CLINICAL DATA:  Shortness of breath, hemoptysis. EXAM: CHEST - 2 VIEW COMPARISON:  Chest x-ray dated July 06, 2017. FINDINGS: The  heart size and mediastinal contours are within normal limits. Normal pulmonary vascularity. No focal consolidation, pleural effusion, or pneumothorax. No acute osseous abnormality. Prior right proximal humerus ORIF. IMPRESSION: No active cardiopulmonary disease. Electronically Signed   By: Obie DredgeWilliam T Derry M.D.   On: 09/01/2017 09:32   Ct Angio Chest Pe W And/or Wo Contrast  Result Date: 09/01/2017 CLINICAL DATA:  Hemoptysis for 1 year EXAM: CT ANGIOGRAPHY CHEST WITH CONTRAST TECHNIQUE: Multidetector CT imaging of the chest was performed using the standard protocol during bolus administration of intravenous contrast. Multiplanar CT image reconstructions and MIPs were obtained to evaluate the vascular anatomy. CONTRAST:  100mL ISOVUE-370 IOPAMIDOL (ISOVUE-370) INJECTION 76% COMPARISON:  None. FINDINGS: Cardiovascular: There are no filling defects in the pulmonary arterial tree to suggest acute pulmonary thromboembolism. There is no evidence of aortic aneurysm. There is no obvious dissection. Mediastinum/Nodes:  No abnormal mediastinal adenopathy. Residual thymus is noted. Peribronchovascular soft tissue thickening in the hilar regions. Esophagus is unremarkable. Visualized thyroid is within normal limits. No pericardial effusion. Lungs/Pleura: Clear lungs.  No pneumothorax or pleural effusion. Upper Abdomen: No acute abnormality. Musculoskeletal: No vertebral compression. Multiple endplate Schmorl's nodes in the mid and lower thoracic spine. No acute rib fracture. Review of the MIP images confirms the above findings. IMPRESSION: No evidence of pulmonary thromboembolism. No active cardiopulmonary disease. Electronically Signed   By: Jolaine Click M.D.   On: 09/01/2017 11:54    Procedures Procedures (including critical care time)  Medications Ordered in ED Medications  albuterol (PROVENTIL HFA;VENTOLIN HFA) 108 (90 Base) MCG/ACT inhaler 2 puff (has no administration in time range)  ipratropium-albuterol  (DUONEB) 0.5-2.5 (3) MG/3ML nebulizer solution 3 mL (3 mLs Nebulization Given 09/01/17 0937)  acetaminophen (TYLENOL) tablet 650 mg (650 mg Oral Given 09/01/17 0932)  cefTRIAXone (ROCEPHIN) injection 250 mg (250 mg Intramuscular Given 09/01/17 1110)  azithromycin (ZITHROMAX) tablet 1,000 mg (1,000 mg Oral Given 09/01/17 1110)  lidocaine (PF) (XYLOCAINE) 1 % injection (2 mLs  Given 09/01/17 1111)  iopamidol (ISOVUE-370) 76 % injection 100 mL (100 mLs Intravenous Contrast Given 09/01/17 1135)     Initial Impression / Assessment and Plan / ED Course  I have reviewed the triage vital signs and the nursing notes.  Pertinent labs & imaging results that were available during my care of the patient were reviewed by me and considered in my medical decision making (see chart for details).     Patient with significant increase in wheezing after receiving albuterol and Atrovent nebulizer.  He was given an MDI for home use.  X-rays, CT imaging and labs were reviewed and discussed with patient.  Hemoglobin stable, liver enzymes normal, no evidence for coagulopathy at this time, however discussed need for follow-up care if his symptoms persist or worsen, he was given referral for this.  Additionally he has concerns about his chronic right shoulder pain since he has had surgery at the site.  He does occasionally get a Percocet and valium from a friend due to this pain on discussion of his positive UDS, although tries to stick with ibuprofen and Tylenol for pain relief.   Discussed diagnosis of urethritis and need to avoid intercourse until symptoms are resolved or times 1 week.  He was treated with Zithromax and Rocephin here.  Referrals were given for follow-up care and for obtaining primary care.  The patient appears reasonably screened and/or stabilized for discharge and I doubt any other medical condition or other [facility] requiring further screening, evaluation, or treatment in the ED at this time prior to  discharge.   Final Clinical Impressions(s) / ED Diagnoses   Final diagnoses:  Cough with hemoptysis  Wheezing  Urethritis    ED Discharge Orders    None       Victoriano Lain 09/01/17 1223    Eber Hong, MD 09/02/17 [phone]

## 2017-09-01 NOTE — ED Notes (Signed)
Sputum noted to have blood in it

## 2017-09-03 LAB — GC/CHLAMYDIA PROBE AMP (~~LOC~~) NOT AT ARMC
CHLAMYDIA, DNA PROBE: NEGATIVE
NEISSERIA GONORRHEA: POSITIVE — AB

## 2017-09-03 LAB — HIV ANTIBODY (ROUTINE TESTING W REFLEX): HIV Screen 4th Generation wRfx: NONREACTIVE

## 2017-09-27 ENCOUNTER — Other Ambulatory Visit: Payer: Self-pay

## 2017-09-27 ENCOUNTER — Encounter (HOSPITAL_COMMUNITY): Payer: Self-pay

## 2017-09-27 ENCOUNTER — Emergency Department (HOSPITAL_COMMUNITY)
Admission: EM | Admit: 2017-09-27 | Discharge: 2017-09-27 | Disposition: A | Discharge status: Home / Self Care | Payer: Self-pay | Attending: Emergency Medicine | Admitting: Emergency Medicine

## 2017-09-27 DIAGNOSIS — F17210 Nicotine dependence, cigarettes, uncomplicated: Secondary | ICD-10-CM | POA: Insufficient documentation

## 2017-09-27 DIAGNOSIS — K0510 Chronic gingivitis, plaque induced: Secondary | ICD-10-CM | POA: Insufficient documentation

## 2017-09-27 MED ORDER — MAGIC MOUTHWASH W/LIDOCAINE: 5.0000 mL | Freq: Four times a day (QID) | ORAL | 0 refills | Status: DC | PRN

## 2017-09-27 MED ORDER — ACYCLOVIR 800 MG PO TABS
800.0000 mg | ORAL_TABLET | Freq: Once | ORAL | Status: AC
  Administered 2017-09-27: 800 mg via ORAL
  Filled 2017-09-27: qty 1

## 2017-09-27 MED ORDER — ACYCLOVIR 400 MG PO TABS: 400.0000 mg | ORAL_TABLET | Freq: Three times a day (TID) | ORAL | 0 refills | Status: DC

## 2017-09-27 MED ORDER — TRAMADOL HCL 50 MG PO TABS: 50.0000 mg | ORAL_TABLET | Freq: Four times a day (QID) | ORAL | 0 refills | Status: DC | PRN

## 2017-09-27 MED ORDER — LIDOCAINE VISCOUS 2 % MT SOLN
15.0000 mL | Freq: Once | OROMUCOSAL | Status: AC
  Administered 2017-09-27: 15 mL via OROMUCOSAL
  Filled 2017-09-27: qty 15

## 2017-09-27 MED ORDER — DEXAMETHASONE SODIUM PHOSPHATE 10 MG/ML IJ SOLN
10.0000 mg | Freq: Once | INTRAMUSCULAR | Status: AC
  Administered 2017-09-27: 10 mg via INTRAMUSCULAR
  Filled 2017-09-27: qty 1

## 2017-09-27 MED ORDER — HYDROCODONE-ACETAMINOPHEN 5-325 MG PO TABS
1.0000 | ORAL_TABLET | Freq: Once | ORAL | Status: AC
  Administered 2017-09-27: 1 via ORAL
  Filled 2017-09-27: qty 1

## 2017-09-27 NOTE — ED Triage Notes (Signed)
Pt reports recurrent episodes of ulcers in his mouth.

## 2017-09-27 NOTE — ED Provider Notes (Signed)
[facility] EMERGENCY DEPARTMENT Provider Note   CSN: [id number] Arrival date & time: 09/27/17  0201     History   Chief Complaint Chief Complaint  Patient presents with  . Mouth Lesions    HPI Brad Todd is a 27 y.o. male.  Patient presents to the emergency department for evaluation of mouth pain.  Patient reports that he gets recurrence mouth ulcers occasionally.  For the last several days he has had multiple lesions in sinus mouth that have been very painful.  Reports he has had difficulty sleeping and eating because of the pain.  He has not had any recent illness otherwise.  No fever.  No lesions on the outside of from his mouth or lips.  He reports taking acyclovir for this in the past but does not have insurance.  He just got a job and is waiting for his first paycheck.     Past Medical History:  Diagnosis Date  . Chronic shoulder pain   . Polysubstance abuse (HCC)    cocaine, opiates, benzos  . Rhabdomyolysis 2011   accutane induced    There are no active problems to display for this patient.   Past Surgical History:  Procedure Laterality Date  . ORIF PROXIMAL HUMERUS FRACTURE  05/2014        Home Medications    Prior to Admission medications   Medication Sig Start Date End Date Taking? Authorizing Provider  acetaminophen (TYLENOL) 500 MG tablet Take 2 tablets (1,000 mg total) by mouth every 6 (six) hours as needed. 02/27/16   Geoffery Lyonselo, Douglas, MD  acyclovir (ZOVIRAX) 400 MG tablet Take 1 tablet (400 mg total) by mouth 3 (three) times daily. 09/27/17   Gilda CreasePollina, Christopher J, MD  clindamycin (CLEOCIN) 300 MG capsule Take 1 capsule (300 mg total) by mouth 4 (four) times daily. X 7 days 02/27/16   Geoffery Lyonselo, Douglas, MD  ibuprofen (ADVIL,MOTRIN) 600 MG tablet Take 1 tablet (600 mg total) by mouth every 6 (six) hours as needed. 02/27/16   Geoffery Lyonselo, Douglas, MD  magic mouthwash w/lidocaine SOLN Take 5 mLs by mouth 4 (four) times daily as needed for mouth pain. 09/27/17    Gilda CreasePollina, Christopher J, MD  traMADol (ULTRAM) 50 MG tablet Take 1 tablet (50 mg total) by mouth every 6 (six) hours as needed. 09/27/17   Gilda CreasePollina, Christopher J, MD    Family History No family history on file.  Social History Social History   Tobacco Use  . Smoking status: Current Every Day Smoker    Types: Cigarettes  . Smokeless tobacco: Never Used  Substance Use Topics  . Alcohol use: Yes    Comment: weekly  . Drug use: Not Currently    Types: IV    Comment: heroin     Allergies   Patient has no known allergies.   Review of Systems Review of Systems  HENT: Positive for mouth sores.   All other systems reviewed and are negative.    Physical Exam Updated Vital Signs Ht 5\' 7"  (1.702 m)   Wt 72.6 kg (160 lb)   BMI 25.06 kg/m   Physical Exam  Constitutional: He is oriented to person, place, and time. He appears well-developed and well-nourished. No distress.  HENT:  Head: Normocephalic and atraumatic.  Right Ear: Hearing normal.  Left Ear: Hearing normal.  Nose: Nose normal.  Mouth/Throat: Oropharynx is clear and moist and mucous membranes are normal.  Multiple superficial, small ulcers on buccal mucosa, underside of tongue, side of tongue,  lower lip mucosa  Eyes: Pupils are equal, round, and reactive to light. Conjunctivae and EOM are normal.  Neck: Normal range of motion. Neck supple.  Cardiovascular: Regular rhythm, S1 normal and S2 normal. Exam reveals no gallop and no friction rub.  No murmur heard. Pulmonary/Chest: Effort normal and breath sounds normal. No respiratory distress. He exhibits no tenderness.  Abdominal: Soft. Normal appearance and bowel sounds are normal. There is no hepatosplenomegaly. There is no tenderness. There is no rebound, no guarding, no tenderness at McBurney's point and negative Murphy's sign. No hernia.  Musculoskeletal: Normal range of motion.  Neurological: He is alert and oriented to person, place, and time. He has normal  strength. No cranial nerve deficit or sensory deficit. Coordination normal. GCS eye subscore is 4. GCS verbal subscore is 5. GCS motor subscore is 6.  Skin: Skin is warm, dry and intact. No rash noted. No cyanosis.  Psychiatric: He has a normal mood and affect. His speech is normal and behavior is normal. Thought content normal.  Nursing note and vitals reviewed.    ED Treatments / Results  Labs (all labs ordered are listed, but only abnormal results are displayed) Labs Reviewed - No data to display  EKG None  Radiology No results found.  Procedures Procedures (including critical care time)  Medications Ordered in ED Medications  dexamethasone (DECADRON) injection 10 mg (has no administration in time range)  lidocaine (XYLOCAINE) 2 % viscous mouth solution 15 mL (has no administration in time range)  HYDROcodone-acetaminophen (NORCO/VICODIN) 5-325 MG per tablet 1 tablet (has no administration in time range)  acyclovir (ZOVIRAX) tablet 800 mg (has no administration in time range)     Initial Impression / Assessment and Plan / ED Course  I have reviewed the triage vital signs and the nursing notes.  Pertinent labs & imaging results that were available during my care of the patient were reviewed by me and considered in my medical decision making (see chart for details).     Patient presents with recurrent gingivostomatitis.  He does not appear ill.  Vital signs are normal.  Remainder of examination is unremarkable.  He does not appear dehydrated.  Final Clinical Impressions(s) / ED Diagnoses   Final diagnoses:  Gingivostomatitis    ED Discharge Orders        Ordered    acyclovir (ZOVIRAX) 400 MG tablet  3 times daily     09/27/17 0213    traMADol (ULTRAM) 50 MG tablet  Every 6 hours PRN     09/27/17 0213    magic mouthwash w/lidocaine SOLN  4 times daily PRN     09/27/17 0213       Gilda Crease, MD 09/27/17 [phone]

## 2018-01-12 ENCOUNTER — Emergency Department (HOSPITAL_COMMUNITY)
Admission: EM | Admit: 2018-01-12 | Discharge: 2018-01-12 | Discharge status: Against Medical Advice | Payer: Self-pay | Attending: Emergency Medicine | Admitting: Emergency Medicine

## 2018-01-12 ENCOUNTER — Other Ambulatory Visit: Payer: Self-pay

## 2018-01-12 ENCOUNTER — Encounter (HOSPITAL_COMMUNITY): Payer: Self-pay

## 2018-01-12 ENCOUNTER — Observation Stay (HOSPITAL_COMMUNITY)
Admission: EM | Admit: 2018-01-12 | Discharge: 2018-01-13 | Disposition: A | Discharge status: Other Acute Inpatient Hospital | Payer: Self-pay | Attending: Internal Medicine | Admitting: Internal Medicine

## 2018-01-12 ENCOUNTER — Encounter (HOSPITAL_COMMUNITY): Payer: Self-pay | Admitting: Emergency Medicine

## 2018-01-12 DIAGNOSIS — Y909 Presence of alcohol in blood, level not specified: Secondary | ICD-10-CM | POA: Insufficient documentation

## 2018-01-12 DIAGNOSIS — T40601A Poisoning by unspecified narcotics, accidental (unintentional), initial encounter: Secondary | ICD-10-CM | POA: Diagnosis present

## 2018-01-12 DIAGNOSIS — F17210 Nicotine dependence, cigarettes, uncomplicated: Secondary | ICD-10-CM | POA: Insufficient documentation

## 2018-01-12 DIAGNOSIS — F1290 Cannabis use, unspecified, uncomplicated: Secondary | ICD-10-CM | POA: Insufficient documentation

## 2018-01-12 DIAGNOSIS — F1911 Other psychoactive substance abuse, in remission: Secondary | ICD-10-CM | POA: Insufficient documentation

## 2018-01-12 DIAGNOSIS — Z79899 Other long term (current) drug therapy: Secondary | ICD-10-CM | POA: Insufficient documentation

## 2018-01-12 DIAGNOSIS — T402X1D Poisoning by other opioids, accidental (unintentional), subsequent encounter: Secondary | ICD-10-CM

## 2018-01-12 DIAGNOSIS — R7309 Other abnormal glucose: Secondary | ICD-10-CM | POA: Insufficient documentation

## 2018-01-12 DIAGNOSIS — T402X1A Poisoning by other opioids, accidental (unintentional), initial encounter: Secondary | ICD-10-CM | POA: Insufficient documentation

## 2018-01-12 DIAGNOSIS — Z5329 Procedure and treatment not carried out because of patient's decision for other reasons: Secondary | ICD-10-CM | POA: Insufficient documentation

## 2018-01-12 DIAGNOSIS — F10920 Alcohol use, unspecified with intoxication, uncomplicated: Secondary | ICD-10-CM | POA: Insufficient documentation

## 2018-01-12 DIAGNOSIS — F1010 Alcohol abuse, uncomplicated: Secondary | ICD-10-CM | POA: Insufficient documentation

## 2018-01-12 DIAGNOSIS — R000 Tachycardia, unspecified: Secondary | ICD-10-CM | POA: Insufficient documentation

## 2018-01-12 DIAGNOSIS — G92 Toxic encephalopathy: Secondary | ICD-10-CM | POA: Insufficient documentation

## 2018-01-12 DIAGNOSIS — R45851 Suicidal ideations: Secondary | ICD-10-CM | POA: Insufficient documentation

## 2018-01-12 DIAGNOSIS — G9340 Encephalopathy, unspecified: Secondary | ICD-10-CM

## 2018-01-12 DIAGNOSIS — T401X1A Poisoning by heroin, accidental (unintentional), initial encounter: Secondary | ICD-10-CM | POA: Diagnosis present

## 2018-01-12 LAB — MRSA PCR SCREENING: MRSA by PCR: NEGATIVE

## 2018-01-12 LAB — CBC WITH DIFFERENTIAL/PLATELET
BASOS ABS: 0 10*3/uL (ref 0.0–0.1)
Basophils Relative: 0 %
Eosinophils Absolute: 0.1 10*3/uL (ref 0.0–0.7)
Eosinophils Relative: 1 %
HEMATOCRIT: 44.2 % (ref 39.0–52.0)
Hemoglobin: 15.2 g/dL (ref 13.0–17.0)
LYMPHS PCT: 36 %
Lymphs Abs: 4 10*3/uL (ref 0.7–4.0)
MCH: 29.9 pg (ref 26.0–34.0)
MCHC: 34.4 g/dL (ref 30.0–36.0)
MCV: 86.8 fL (ref 78.0–100.0)
Monocytes Absolute: 0.6 10*3/uL (ref 0.1–1.0)
Monocytes Relative: 6 %
NEUTROS ABS: 6.5 10*3/uL (ref 1.7–7.7)
Neutrophils Relative %: 57 %
Platelets: 264 10*3/uL (ref 150–400)
RBC: 5.09 MIL/uL (ref 4.22–5.81)
RDW: 13.1 % (ref 11.5–15.5)
WBC: 11.3 10*3/uL — AB (ref 4.0–10.5)

## 2018-01-12 LAB — RAPID URINE DRUG SCREEN, HOSP PERFORMED
Amphetamines: NOT DETECTED
BARBITURATES: NOT DETECTED
Benzodiazepines: POSITIVE — AB
COCAINE: POSITIVE — AB
Opiates: POSITIVE — AB
Tetrahydrocannabinol: POSITIVE — AB

## 2018-01-12 LAB — COMPREHENSIVE METABOLIC PANEL
ALBUMIN: 4.5 g/dL (ref 3.5–5.0)
ALT: 23 U/L (ref 0–44)
AST: 26 U/L (ref 15–41)
Alkaline Phosphatase: 80 U/L (ref 38–126)
Anion gap: 9 (ref 5–15)
BILIRUBIN TOTAL: 0.6 mg/dL (ref 0.3–1.2)
BUN: 14 mg/dL (ref 6–20)
CHLORIDE: 105 mmol/L (ref 98–111)
CO2: 29 mmol/L (ref 22–32)
Calcium: 10.5 mg/dL — ABNORMAL HIGH (ref 8.9–10.3)
Creatinine, Ser: 1.06 mg/dL (ref 0.61–1.24)
GFR calc Af Amer: >60 mL/min (ref >60)
Glucose, Bld: 156 mg/dL — ABNORMAL HIGH (ref 70–99)
POTASSIUM: 3.9 mmol/L (ref 3.5–5.1)
SODIUM: 143 mmol/L (ref 135–145)
TOTAL PROTEIN: 7.6 g/dL (ref 6.5–8.1)

## 2018-01-12 LAB — ETHANOL: Alcohol, Ethyl (B): <10 mg/dL (ref <10)

## 2018-01-12 MED ORDER — SODIUM CHLORIDE 0.9 % IV BOLUS
1000.0000 mL | Freq: Once | INTRAVENOUS | Status: AC
  Administered 2018-01-12: 1000 mL via INTRAVENOUS

## 2018-01-12 MED ORDER — FOLIC ACID 1 MG PO TABS
1.0000 mg | ORAL_TABLET | Freq: Every day | ORAL | Status: DC
  Administered 2018-01-12: 1 mg via ORAL
  Filled 2018-01-12: qty 1

## 2018-01-12 MED ORDER — NALOXONE HCL 2 MG/2ML IJ SOSY
PREFILLED_SYRINGE | INTRAMUSCULAR | Status: AC
  Filled 2018-01-12: qty 4

## 2018-01-12 MED ORDER — NICOTINE 7 MG/24HR TD PT24
7.0000 mg | MEDICATED_PATCH | Freq: Every day | TRANSDERMAL | Status: DC
  Administered 2018-01-12: 7 mg via TRANSDERMAL
  Filled 2018-01-12: qty 1

## 2018-01-12 MED ORDER — ONDANSETRON HCL 4 MG PO TABS: 4.0000 mg | ORAL_TABLET | Freq: Four times a day (QID) | ORAL | Status: DC | PRN

## 2018-01-12 MED ORDER — NALOXONE HCL 4 MG/10ML IJ SOLN
0.7500 mg/h | INTRAMUSCULAR | Status: DC
  Administered 2018-01-12 (×2): 0.75 mg/h via INTRAVENOUS
  Filled 2018-01-12 (×2): qty 10

## 2018-01-12 MED ORDER — ZIPRASIDONE MESYLATE 20 MG IM SOLR
10.0000 mg | Freq: Once | INTRAMUSCULAR | Status: AC
  Administered 2018-01-12: 10 mg via INTRAMUSCULAR

## 2018-01-12 MED ORDER — LACTATED RINGERS IV SOLN
INTRAVENOUS | Status: AC
  Administered 2018-01-12: 09:00:00 via INTRAVENOUS

## 2018-01-12 MED ORDER — LORAZEPAM 2 MG/ML IJ SOLN
2.0000 mg | INTRAMUSCULAR | Status: DC | PRN
  Administered 2018-01-12: 2 mg via INTRAVENOUS
  Filled 2018-01-12: qty 1

## 2018-01-12 MED ORDER — ADULT MULTIVITAMIN W/MINERALS CH
1.0000 | ORAL_TABLET | Freq: Every day | ORAL | Status: DC
  Administered 2018-01-12: 1 via ORAL
  Filled 2018-01-12: qty 1

## 2018-01-12 MED ORDER — LACTATED RINGERS IV SOLN: INTRAVENOUS | Status: DC

## 2018-01-12 MED ORDER — ZIPRASIDONE MESYLATE 20 MG IM SOLR
INTRAMUSCULAR | Status: AC
  Administered 2018-01-12: 10 mg via INTRAMUSCULAR
  Filled 2018-01-12: qty 20

## 2018-01-12 MED ORDER — ONDANSETRON HCL 4 MG/2ML IJ SOLN: 4.0000 mg | Freq: Four times a day (QID) | INTRAMUSCULAR | Status: DC | PRN

## 2018-01-12 MED ORDER — VITAMIN B-1 100 MG PO TABS
100.0000 mg | ORAL_TABLET | Freq: Every day | ORAL | Status: DC
  Administered 2018-01-12: 100 mg via ORAL
  Filled 2018-01-12: qty 1

## 2018-01-12 MED ORDER — ENOXAPARIN SODIUM 40 MG/0.4ML ~~LOC~~ SOLN: 40.0000 mg | SUBCUTANEOUS | Status: DC

## 2018-01-12 NOTE — Discharge Instructions (Signed)
You had an overdose of a narcotic.  You received an antidote, which saved your life.  However, that antidote will wear off in a relatively short period of time, while the narcotic will still be in your bloodstream.  When that happens, you can stop breathing and you can die.  I have recommended that you stay in the emergency department for enough time to make sure that you will not have more problems from the narcotic in your bloodstream.  You are leaving against my medical advice.  Even if someone calls for an ambulance when you stop breathing, there is time that the brain is not getting enough oxygen that can lead to permanent brain damage.  If you change your mind about leaving, you are welcome to come back at any time.

## 2018-01-12 NOTE — Progress Notes (Signed)
[facility] is requesting updated vital signs, if patient is urinating on his own, ambulatory and IVC paperwork. LCSW has tried to reach patient's nurse but nurse is unavailable. LCSW has left her contact information with the receptionist for a call back at [phone].   Moss McKy-Sha Demetress Tift MSW, LCSW, LCAS Clinical Social Worker 01/12/2018 4:06 PM

## 2018-01-12 NOTE — Progress Notes (Signed)
No guardian  Marked yes earlier in error

## 2018-01-12 NOTE — ED Notes (Signed)
Patient removed his IV and took off EKG leads and blood pressure, is requesting discharge papers, is accompanied by friend, EDP Preston FleetingGlick made aware.

## 2018-01-12 NOTE — H&P (Addendum)
History and Physical    Brad Todd ZOX:[id number] DOB: 05/14/1991 DOA: 01/12/2018  PCP: Patient, No Pcp Per   Patient coming from: Home  Chief Complaint: AMS/agitation  HPI: Brad Todd is a 27 y.o. male with medical history significant for prior polysubstance abuse and alcohol abuse who presented to the emergency department initially after he was found on the street unconscious by a friend.  He was more awake and alert during his initial evaluation and admitted to drinking 2/5 of vodka and a case of beer and had smoked some marijuana.  His ethanol levels actually came back undetectable and it was ascertained that he had taken oxycodone 5 mg which she was very sensitive to.  He was given an initial naloxone push which helped improve his mental status and he left AMA only to return once again after EMS found him in his bathtub unresponsive.  He was given naloxone once again and became quite combative at which point he was given some Geodon.  He is currently on 2 L nasal cannula with adequate oxygen saturations and is otherwise unresponsive.   ED Course: Vital signs are stable and patient is currently on 2 L nasal cannula with O2 saturation in the high 90th percentile.  He has been placed on Narcan drip.  Laboratory data otherwise unremarkable aside from glucose of 156.  UDS has been ordered.  No anion gap noted, nor is there a QTC prolongation.  Review of Systems: Cannot obtain any history on account of patient's mentation.  Past Medical History:  Diagnosis Date  . Chronic shoulder pain   . Polysubstance abuse (HCC)    cocaine, opiates, benzos  . Rhabdomyolysis 2011   accutane induced    Past Surgical History:  Procedure Laterality Date  . ORIF PROXIMAL HUMERUS FRACTURE  05/2014     reports that he has been smoking cigarettes.  He has never used smokeless tobacco. He reports that he drinks alcohol. He reports that he has current or past drug history. Drug: IV.  No Known  Allergies  History reviewed. No pertinent family history.  Prior to Admission medications   Medication Sig Start Date End Date Taking? Authorizing Provider  acetaminophen (TYLENOL) 500 MG tablet Take 2 tablets (1,000 mg total) by mouth every 6 (six) hours as needed. 02/27/16   Geoffery Lyons, MD  acyclovir (ZOVIRAX) 400 MG tablet Take 1 tablet (400 mg total) by mouth 3 (three) times daily. 09/27/17   Gilda Crease, MD  clindamycin (CLEOCIN) 300 MG capsule Take 1 capsule (300 mg total) by mouth 4 (four) times daily. X 7 days 02/27/16   Geoffery Lyons, MD  ibuprofen (ADVIL,MOTRIN) 600 MG tablet Take 1 tablet (600 mg total) by mouth every 6 (six) hours as needed. 02/27/16   Geoffery Lyons, MD  magic mouthwash w/lidocaine SOLN Take 5 mLs by mouth 4 (four) times daily as needed for mouth pain. 09/27/17   Gilda Crease, MD  traMADol (ULTRAM) 50 MG tablet Take 1 tablet (50 mg total) by mouth every 6 (six) hours as needed. 09/27/17   Gilda Crease, MD    Physical Exam: Vitals:   01/12/18 0715 01/12/18 0730 01/12/18 0812 01/12/18 0813  BP: 109/63 106/69  (!) 142/94  Pulse: 100 91 99 84  Resp:  (!) 25  13  Temp:   (!) 97.4 F (36.3 C) (!) 97.3 F (36.3 C)  TempSrc:   Axillary Axillary  SpO2: 100% 100% 98% 97%  Weight:    63.7  kg (140 lb 6.9 oz)  Height:    5\' 8"  (1.727 m)    Constitutional: NAD, unresponsive Vitals:   01/12/18 0715 01/12/18 0730 01/12/18 0812 01/12/18 0813  BP: 109/63 106/69  (!) 142/94  Pulse: 100 91 99 84  Resp:  (!) 25  13  Temp:   (!) 97.4 F (36.3 C) (!) 97.3 F (36.3 C)  TempSrc:   Axillary Axillary  SpO2: 100% 100% 98% 97%  Weight:    63.7 kg (140 lb 6.9 oz)  Height:    5\' 8"  (1.727 m)   Eyes: lids and conjunctivae normal, pupils are pinpoint ENMT: Mucous membranes are moist.  Neck: normal, supple Respiratory: clear to auscultation bilaterally. Normal respiratory effort. No accessory muscle use.  On 2 L nasal cannula Cardiovascular:  Regular rate and rhythm, no murmurs. No extremity edema. Abdomen: no tenderness, no distention. Bowel sounds positive.  Musculoskeletal:  No joint deformity upper and lower extremities.   Skin: no rashes, lesions, ulcers.  Psychiatric: Cannot be assessed.  Labs on Admission: I have personally reviewed following labs and imaging studies  CBC: Recent Labs  Lab 01/12/18 0230  WBC 11.3*  NEUTROABS 6.5  HGB 15.2  HCT 44.2  MCV 86.8  PLT 264   Basic Metabolic Panel: Recent Labs  Lab 01/12/18 0230  NA 143  K 3.9  CL 105  CO2 29  GLUCOSE 156*  BUN 14  CREATININE 1.06  CALCIUM 10.5*   GFR: Estimated Creatinine Clearance: 96.8 mL/min (by C-G formula based on SCr of 1.06 mg/dL). Liver Function Tests: Recent Labs  Lab 01/12/18 0230  AST 26  ALT 23  ALKPHOS 80  BILITOT 0.6  PROT 7.6  ALBUMIN 4.5   No results for input(s): LIPASE, AMYLASE in the last 168 hours. No results for input(s): AMMONIA in the last 168 hours. Coagulation Profile: No results for input(s): INR, PROTIME in the last 168 hours. Cardiac Enzymes: No results for input(s): CKTOTAL, CKMB, CKMBINDEX, TROPONINI in the last 168 hours. BNP (last 3 results) No results for input(s): PROBNP in the last 8760 hours. HbA1C: No results for input(s): HGBA1C in the last 72 hours. CBG: No results for input(s): GLUCAP in the last 168 hours. Lipid Profile: No results for input(s): CHOL, HDL, LDLCALC, TRIG, CHOLHDL, LDLDIRECT in the last 72 hours. Thyroid Function Tests: No results for input(s): TSH, T4TOTAL, FREET4, T3FREE, THYROIDAB in the last 72 hours. Anemia Panel: No results for input(s): VITAMINB12, FOLATE, FERRITIN, TIBC, IRON, RETICCTPCT in the last 72 hours. Urine analysis:    Component Value Date/Time   COLORURINE YELLOW 09/01/2017 0849   APPEARANCEUR HAZY (A) 09/01/2017 0849   LABSPEC 1.019 09/01/2017 0849   PHURINE 5.0 09/01/2017 0849   GLUCOSEU NEGATIVE 09/01/2017 0849   HGBUR NEGATIVE 09/01/2017  0849   BILIRUBINUR NEGATIVE 09/01/2017 0849   KETONESUR NEGATIVE 09/01/2017 0849   PROTEINUR NEGATIVE 09/01/2017 0849   UROBILINOGEN 0.2 09/23/2010 1103   NITRITE NEGATIVE 09/01/2017 0849   LEUKOCYTESUR LARGE (A) 09/01/2017 0849    Radiological Exams on Admission: No results found.  EKG: Independently reviewed. ST 107bpm. QTc 423ms.  Assessment/Plan Principal Problem:   Acute encephalopathy Active Problems:   Opiate overdose (HCC)    1. Acute toxic encephalopathy secondary to suspected opiate overdose.  Patient appears to have responded to Narcan both times, but had to receive Geodon on account of significant agitation on the second count.  He is currently on Narcan drip which we will continue and is quite sedated from the  Geodon.  He will require close monitoring in stepdown unit with neuro checks.  We will have to assess mentation and motives once patient is more awake and alert with plans for psychiatry evaluation at that point.  No significant anion gap or QTC prolongation noted.  UDS has been ordered.  He has high risk for leaving AMA and may do so if he is alert and oriented and able to understand the risks, otherwise he will be involuntarily committed.   DVT prophylaxis: Lovenox Code Status: Full Family Communication: None at bedside Disposition Plan:Continue Narcan drip and monitor for alertness; telepsych evaluation once awake Consults called:None Admission status: Obs, SDU   Rino Hosea D Sherryll Burger DO [facility] Pager [phone]  If 7PM-7AM, please contact night-coverage [email or web address] Password [email or web address]  01/12/2018, 8:25 AM

## 2018-01-12 NOTE — ED Triage Notes (Signed)
Pt was found on the side of the road by a civilian. Pt was dropped off a the ER entrance. Pt states he has drank 2 fifths of liquor and a case of beer.

## 2018-01-12 NOTE — Progress Notes (Signed)
Patient expressed concern for spiritual support and prayer. No family member was present during visit. Listened supportively to patient validated feelings and helped him begin life review. Provided a sense of connectedness and purpose.

## 2018-01-12 NOTE — Progress Notes (Signed)
Patient has been assessed by TTS and is determined to be suicidal for which IVC will have to continue until patient can have inpatient behavioral health placement.  He is currently cleared from a medical standpoint.

## 2018-01-12 NOTE — ED Notes (Signed)
Pt O2 sat was 90-92%, placed on 2L [place] and is now sat'ing at 97-99%

## 2018-01-12 NOTE — ED Provider Notes (Signed)
[facility] EMERGENCY DEPARTMENT Provider Note   CSN: [id number] Arrival date & time: 01/12/18  0225     History   Chief Complaint No chief complaint on file.   HPI Brad Todd is a 27 y.o. male.  The history is provided by a friend. The history is limited by the condition of the patient (Altered mental status).  He was reportedly found on the street and had naloxone administered to him.  He admits to drinking two fifths of vodka and a case of beer and smoked some marijuana.  No other history is immediately available.  Past Medical History:  Diagnosis Date  . Chronic shoulder pain   . Polysubstance abuse (HCC)    cocaine, opiates, benzos  . Rhabdomyolysis 2011   accutane induced    There are no active problems to display for this patient.   Past Surgical History:  Procedure Laterality Date  . ORIF PROXIMAL HUMERUS FRACTURE  05/2014        Home Medications    Prior to Admission medications   Medication Sig Start Date End Date Taking? Authorizing Provider  acetaminophen (TYLENOL) 500 MG tablet Take 2 tablets (1,000 mg total) by mouth every 6 (six) hours as needed. 02/27/16   Geoffery Lyons, MD  acyclovir (ZOVIRAX) 400 MG tablet Take 1 tablet (400 mg total) by mouth 3 (three) times daily. 09/27/17   Gilda Crease, MD  clindamycin (CLEOCIN) 300 MG capsule Take 1 capsule (300 mg total) by mouth 4 (four) times daily. X 7 days 02/27/16   Geoffery Lyons, MD  ibuprofen (ADVIL,MOTRIN) 600 MG tablet Take 1 tablet (600 mg total) by mouth every 6 (six) hours as needed. 02/27/16   Geoffery Lyons, MD  magic mouthwash w/lidocaine SOLN Take 5 mLs by mouth 4 (four) times daily as needed for mouth pain. 09/27/17   Gilda Crease, MD  traMADol (ULTRAM) 50 MG tablet Take 1 tablet (50 mg total) by mouth every 6 (six) hours as needed. 09/27/17   Gilda Crease, MD    Family History No family history on file.  Social History Social History   Tobacco Use  .  Smoking status: Current Every Day Smoker    Types: Cigarettes  . Smokeless tobacco: Never Used  Substance Use Topics  . Alcohol use: Yes    Comment: weekly  . Drug use: Not Currently    Types: IV    Comment: heroin     Allergies   Patient has no known allergies.   Review of Systems Review of Systems  Unable to perform ROS: Mental status change     Physical Exam Updated Vital Signs BP (!) 154/88   Pulse (!) 141   Resp (!) 22   SpO2 97%   Physical Exam  Nursing note and vitals reviewed.  Clinically intoxicated 27 year old male, resting comfortably and in no acute distress. Vital signs are sniffing and for elevated blood pressure, rapid heart rate, rapid respiratory rate. Oxygen saturation is 97%, which is normal. Head is normocephalic and atraumatic. PERRLA, EOMI. Oropharynx is clear. Neck is nontender and supple without adenopathy or JVD. Back is nontender and there is no CVA tenderness. Lungs are clear without rales, wheezes, or rhonchi. Chest is nontender. Heart is tachycardic without murmur. Abdomen is soft, flat, nontender without masses or hepatosplenomegaly and peristalsis is normoactive. Extremities have no cyanosis or edema, full range of motion is present. Skin is warm and dry without rash. Neurologic: Somnolent but easily arousable, speech moderately  slurred, cranial nerves are intact, there are no motor or sensory deficits.  ED Treatments / Results  Labs (all labs ordered are listed, but only abnormal results are displayed) Labs Reviewed  COMPREHENSIVE METABOLIC PANEL - Abnormal; Notable for the following components:      Result Value   Glucose, Bld 156 (*)    Calcium 10.5 (*)    All other components within normal limits  CBC WITH DIFFERENTIAL/PLATELET - Abnormal; Notable for the following components:   WBC 11.3 (*)    All other components within normal limits  ETHANOL    EKG EKG Interpretation  Date/Time:  Saturday January 12 2018 02:25:36  EDT Ventricular Rate:  134 PR Interval:    QRS Duration: 84 QT Interval:  286 QTC Calculation: 427 R Axis:   95 Text Interpretation:  Sinus tachycardia Borderline right axis deviation Borderline T abnormalities, inferior leads When compared with ECG of 07/28/2017, HEART RATE has increased Confirmed by Dione BoozeGlick, Rylen Swindler ([id number]) on 01/12/2018 2:28:36 AM   Procedures Procedures  CRITICAL CARE Performed by: Dione Boozeavid Mayara Paulson Total critical care time: 60 minutes Critical care time was exclusive of separately billable procedures and treating other patients. Critical care was necessary to treat or prevent imminent or life-threatening deterioration. Critical care was time spent personally by me on the following activities: development of treatment plan with patient and/or surrogate as well as nursing, discussions with consultants, evaluation of patient's response to treatment, examination of patient, obtaining history from patient or surrogate, ordering and performing treatments and interventions, ordering and review of laboratory studies, ordering and review of radiographic studies, pulse oximetry and re-evaluation of patient's condition.  Medications Ordered in ED Medications  sodium chloride 0.9 % bolus 1,000 mL (has no administration in time range)     Initial Impression / Assessment and Plan / ED Course  I have reviewed the triage vital signs and the nursing notes.  Pertinent lab results that were available during my care of the patient were reviewed by me and considered in my medical decision making (see chart for details).  Alcohol intoxication with possible opiate overdose.  Old records are reviewed, and he did have an emergency department visit in February of this year for accidental opiate overdose.  Because of tachycardia, he will be given fluids.  Screening labs are obtained including ethanol level and drug screen.  He will need to be observed for at least 4 hours in the emergency  department.  Patient has become awake and alert and states he wants to leave.  Ethanol level has come back undetectable.  Patient now tells me that he took 5 mg oxycodone and apparently he is just very sensitive to that.  He has a friend with him.  I explained to them that naloxone wears off in about 1 hour, but oxycodone stays in the bloodstream 4-6 hours and that if he leaves, he can suffer a recurrent respiratory arrest and die or suffer irreparable brain damage.  Patient expressed understanding.  His friend tried to convince him to stay but was unable to do so.  However, patient was able to hold a conversation and explained to me that he understood the ramifications of leaving, so he was felt to be capable of legally making the decision of leaving AGAINST MEDICAL ADVICE.  I have asked him to wait for me to print up discharge instructions, however he left before that could be done.  Final Clinical Impressions(s) / ED Diagnoses   Final diagnoses:  Opiate overdose, accidental or  unintentional, initial encounter (HCC)  Elevated glucose level    ED Discharge Orders    None       Dione Booze, MD 01/12/18 [phone]

## 2018-01-12 NOTE — ED Provider Notes (Signed)
[facility] EMERGENCY DEPARTMENT Provider Note   CSN: [id number] Arrival date & time: 01/12/18  0600     History   Chief Complaint Chief Complaint  Patient presents with  . Drug Overdose    HPI Brad Todd is a 27 y.o. male.  The history is provided by the patient and the EMS personnel.  He has a history of polysubstance abuse and had been in the emergency department earlier tonight with an opioid overdose.  He left AGAINST MEDICAL ADVICE, and apparently was found unresponsive in the shower.  EMS gave naloxone, but he was combative after that.  Respirations again became markedly diminished and they had called the ED requesting haloperidol be given concurrently with naloxone.  His request was denied because he was already having significant respiratory depression.  Patient denies taking any other medications or consuming any alcohol or using any drugs since leaving the ED.  He also is trying and says that he was fine and that nothing it happened.  He wants to leave AGAINST MEDICAL ADVICE.  Past Medical History:  Diagnosis Date  . Chronic shoulder pain   . Polysubstance abuse (HCC)    cocaine, opiates, benzos  . Rhabdomyolysis 2011   accutane induced    Patient Active Problem List   Diagnosis Date Noted  . Opiate overdose (HCC) 01/12/2018    Past Surgical History:  Procedure Laterality Date  . ORIF PROXIMAL HUMERUS FRACTURE  05/2014        Home Medications    Prior to Admission medications   Medication Sig Start Date End Date Taking? Authorizing Provider  acetaminophen (TYLENOL) 500 MG tablet Take 2 tablets (1,000 mg total) by mouth every 6 (six) hours as needed. 02/27/16   Geoffery Lyonselo, Douglas, MD  acyclovir (ZOVIRAX) 400 MG tablet Take 1 tablet (400 mg total) by mouth 3 (three) times daily. 09/27/17   Gilda CreasePollina, Christopher J, MD  clindamycin (CLEOCIN) 300 MG capsule Take 1 capsule (300 mg total) by mouth 4 (four) times daily. X 7 days 02/27/16   Geoffery Lyonselo, Douglas, MD    ibuprofen (ADVIL,MOTRIN) 600 MG tablet Take 1 tablet (600 mg total) by mouth every 6 (six) hours as needed. 02/27/16   Geoffery Lyonselo, Douglas, MD  magic mouthwash w/lidocaine SOLN Take 5 mLs by mouth 4 (four) times daily as needed for mouth pain. 09/27/17   Gilda CreasePollina, Christopher J, MD  traMADol (ULTRAM) 50 MG tablet Take 1 tablet (50 mg total) by mouth every 6 (six) hours as needed. 09/27/17   Gilda CreasePollina, Christopher J, MD    Family History History reviewed. No pertinent family history.  Social History Social History   Tobacco Use  . Smoking status: Current Every Day Smoker    Types: Cigarettes  . Smokeless tobacco: Never Used  Substance Use Topics  . Alcohol use: Yes    Comment: weekly  . Drug use: Not Currently    Types: IV    Comment: heroin     Allergies   Patient has no known allergies.   Review of Systems Review of Systems  All other systems reviewed and are negative.    Physical Exam Updated Vital Signs BP (!) 146/108 (BP Location: Right Arm)   Pulse (!) 104   Temp (!) 97.3 F (36.3 C) (Oral)   Resp (!) 23   Ht 5\' 7"  (1.702 m)   Wt 72.6 kg (160 lb)   SpO2 97%   BMI 25.06 kg/m   Physical Exam  Nursing note and vitals reviewed.  26  year old male, resting comfortably and in no acute distress. Vital signs are significant for elevated blood pressure, heart rate, respirations. Oxygen saturation is 97%, which is normal. Head is normocephalic and atraumatic. PERRLA, EOMI. Oropharynx is clear. Neck is nontender and supple without adenopathy or JVD. Back is nontender and there is no CVA tenderness. Lungs are clear without rales, wheezes, or rhonchi. Chest is nontender. Heart has regular rate and rhythm without murmur. Abdomen is soft, flat, nontender without masses or hepatosplenomegaly and peristalsis is normoactive. Extremities have no cyanosis or edema, full range of motion is present. Skin is warm and dry without rash. Neurologic: Mental status is normal, cranial nerves  are intact, there are no motor or sensory deficits.  ED Treatments / Results  Labs (all labs ordered are listed, but only abnormal results are displayed) Labs Reviewed  RAPID URINE DRUG SCREEN, HOSP PERFORMED - Abnormal; Notable for the following components:      Result Value   Opiates POSITIVE (*)    Cocaine POSITIVE (*)    Benzodiazepines POSITIVE (*)    Tetrahydrocannabinol POSITIVE (*)    All other components within normal limits  MRSA PCR SCREENING  ETHANOL  COMPREHENSIVE METABOLIC PANEL  CBC    EKG EKG Interpretation  Date/Time:  Saturday January 12 2018 06:05:16 EDT Ventricular Rate:  107 PR Interval:    QRS Duration: 94 QT Interval:  317 QTC Calculation: 423 R Axis:   84 Text Interpretation:  Sinus tachycardia Baseline wander in lead(s) II III aVF When compared with ECG of EARLIER SAME DATE HEART RATE has decreased Confirmed by Dione Booze ([id number]) on 01/12/2018 6:28:41 AM   Radiology No results found.  Procedures Procedures  CRITICAL CARE Performed by: Dione Booze Total critical care time: 35 minutes Critical care time was exclusive of separately billable procedures and treating other patients. Critical care was necessary to treat or prevent imminent or life-threatening deterioration. Critical care was time spent personally by me on the following activities: development of treatment plan with patient and/or surrogate as well as nursing, discussions with consultants, evaluation of patient's response to treatment, examination of patient, obtaining history from patient or surrogate, ordering and performing treatments and interventions, ordering and review of laboratory studies, ordering and review of radiographic studies, pulse oximetry and re-evaluation of patient's condition.  Medications Ordered in ED Medications  naloxone HCl (NARCAN) 4 mg in dextrose 5 % 250 mL infusion (has no administration in time range)  ziprasidone (GEODON) injection 10 mg (10 mg  Intramuscular Given 01/12/18 6045)     Initial Impression / Assessment and Plan / ED Course  I have reviewed the triage vital signs and the nursing notes.  Pertinent lab results that were available during my care of the patient were reviewed by me and considered in my medical decision making (see chart for details).  Recurrent respiratory depression from opioid overdose.  Old records are reviewed confirming ED visit earlier today.  This episode of respiratory depression occurred 3.5 hours after his presentation to the emergency department.  I am not sure what he took, but plain oxycodone should have been having a declining level at this point.  Since I am not sure what he took, it is felt safest to put him on a naloxone drip.  Patient's desire to leave AGAINST MEDICAL ADVICE is felt to be suicidal, so he is placed in involuntary commitment for as long as it is needed to treat him.  He became agitated upon learning that he would not  be allowed to leave, and required sedation with ziprasidone.  Case is discussed with Dr. Robb Matar of [facility], who agrees to admit the patient.  Final Clinical Impressions(s) / ED Diagnoses   Final diagnoses:  Opioid overdose, accidental or unintentional, subsequent encounter    ED Discharge Orders    None       Dione Booze, MD 01/12/18 2238

## 2018-01-12 NOTE — Discharge Summary (Signed)
Physician Discharge Summary  Brad Todd IEP:[id number] DOB: 22-Nov-1990 DOA: 01/12/2018  PCP: Patient, No Pcp Per  Admit date: 01/12/2018  Discharge date: 01/12/2018  Admitted From:Home  Disposition:  Inpatient behavioral health  Home Health:N/A  Equipment/Devices:N/A  Discharge Condition:Stable-Medically cleared  CODE STATUS: Full  Diet recommendation: Regular  Brief/Interim Summary:  Brad Todd is a 27 y.o. male with medical history significant for prior polysubstance abuse and alcohol abuse who presented to the emergency department initially after he was found on the street unconscious by a friend.  He was more awake and alert during his initial evaluation and admitted to drinking 2/5 of vodka and a case of beer and had smoked some marijuana.  His ethanol levels actually came back undetectable and it was ascertained that he had taken oxycodone 5 mg which she was very sensitive to.  He was given an initial naloxone push which helped improve his mental status and he left AMA only to return once again after EMS found him in his bathtub unresponsive.  He was given naloxone once again and became quite combative at which point he was given some Geodon.  He has been monitored in the stepdown unit and was placed on a Narcan drip.  After short period of time he regained full consciousness and denied any complaints or concerns.  He was evaluated by TTS and is noted to require inpatient behavioral health placement and likely had some suicidal intent for which involuntary commitment will be continued until patient can be moved to behavioral health facility.  He is currently medically cleared and Narcan drip has been discontinued.  He has no other significant health problems aside from previous history of polysubstance abuse.   Discharge Diagnoses:  Principal Problem:   Acute encephalopathy Active Problems:   Opiate overdose [facility])    Discharge Instructions   Allergies as of 01/12/2018    No Known Allergies     Medication List    You have not been prescribed any medications.     No Known Allergies  Consultations:  None   Procedures/Studies: No results found.   Subjective:   Discharge Exam: Vitals:   01/12/18 0813 01/12/18 0900  BP: (!) 142/94   Pulse: 84 89  Resp: 13 10  Temp: (!) 97.3 F (36.3 C)   SpO2: 97% 100%   Vitals:   01/12/18 0730 01/12/18 0812 01/12/18 0813 01/12/18 0900  BP: 106/69  (!) 142/94   Pulse: 91 99 84 89  Resp: (!) 25  13 10   Temp:  (!) 97.4 F (36.3 C) (!) 97.3 F (36.3 C)   TempSrc:  Axillary Axillary   SpO2: 100% 98% 97% 100%  Weight:   63.7 kg (140 lb 6.9 oz)   Height:   5\' 8"  (1.727 m)     General: Pt is alert, awake, not in acute distress Cardiovascular: RRR, S1/S2 +, no rubs, no gallops Respiratory: CTA bilaterally, no wheezing, no rhonchi Abdominal: Soft, NT, ND, bowel sounds + Extremities: no edema, no cyanosis    The results of significant diagnostics from this hospitalization (including imaging, microbiology, ancillary and laboratory) are listed below for reference.     Microbiology: Recent Results (from the past 240 hour(s))  MRSA PCR Screening     Status: None   Collection Time: 01/12/18  8:16 AM  Result Value Ref Range Status   MRSA by PCR NEGATIVE NEGATIVE Final    Comment:        The GeneXpert MRSA Assay (FDA approved  for NASAL specimens only), is one component of a comprehensive MRSA colonization surveillance program. It is not intended to diagnose MRSA infection nor to guide or monitor treatment for MRSA infections. Performed at [facility], [address] [place], [place] [address]      Labs: BNP (last 3 results) No results for input(s): BNP in the last 8760 hours. Basic Metabolic Panel: Recent Labs  Lab 01/12/18 0230  NA 143  K 3.9  CL 105  CO2 29  GLUCOSE 156*  BUN 14  CREATININE 1.06  CALCIUM 10.5*   Liver Function Tests: Recent Labs  Lab 01/12/18 0230  AST  26  ALT 23  ALKPHOS 80  BILITOT 0.6  PROT 7.6  ALBUMIN 4.5   No results for input(s): LIPASE, AMYLASE in the last 168 hours. No results for input(s): AMMONIA in the last 168 hours. CBC: Recent Labs  Lab 01/12/18 0230  WBC 11.3*  NEUTROABS 6.5  HGB 15.2  HCT 44.2  MCV 86.8  PLT 264   Cardiac Enzymes: No results for input(s): CKTOTAL, CKMB, CKMBINDEX, TROPONINI in the last 168 hours. BNP: Invalid input(s): POCBNP CBG: No results for input(s): GLUCAP in the last 168 hours. D-Dimer No results for input(s): DDIMER in the last 72 hours. Hgb A1c No results for input(s): HGBA1C in the last 72 hours. Lipid Profile No results for input(s): CHOL, HDL, LDLCALC, TRIG, CHOLHDL, LDLDIRECT in the last 72 hours. Thyroid function studies No results for input(s): TSH, T4TOTAL, T3FREE, THYROIDAB in the last 72 hours.  Invalid input(s): FREET3 Anemia work up No results for input(s): VITAMINB12, FOLATE, FERRITIN, TIBC, IRON, RETICCTPCT in the last 72 hours. Urinalysis    Component Value Date/Time   COLORURINE YELLOW 09/01/2017 0849   APPEARANCEUR HAZY (A) 09/01/2017 0849   LABSPEC 1.019 09/01/2017 0849   PHURINE 5.0 09/01/2017 0849   GLUCOSEU NEGATIVE 09/01/2017 0849   HGBUR NEGATIVE 09/01/2017 0849   BILIRUBINUR NEGATIVE 09/01/2017 0849   KETONESUR NEGATIVE 09/01/2017 0849   PROTEINUR NEGATIVE 09/01/2017 0849   UROBILINOGEN 0.2 09/23/2010 1103   NITRITE NEGATIVE 09/01/2017 0849   LEUKOCYTESUR LARGE (A) 09/01/2017 0849   Sepsis Labs Invalid input(s): PROCALCITONIN,  WBC,  LACTICIDVEN Microbiology Recent Results (from the past 240 hour(s))  MRSA PCR Screening     Status: None   Collection Time: 01/12/18  8:16 AM  Result Value Ref Range Status   MRSA by PCR NEGATIVE NEGATIVE Final    Comment:        The GeneXpert MRSA Assay (FDA approved for NASAL specimens only), is one component of a comprehensive MRSA colonization surveillance program. It is not intended to diagnose  MRSA infection nor to guide or monitor treatment for MRSA infections. Performed at [facility], [address] [place], [place] [address]      Time coordinating discharge: 40 minutes  SIGNED:   Erick BlinksPratik D Thaison Kolodziejski, DO [facility] 01/12/2018, 3:14 PM Pager [phone]  If 7PM-7AM, please contact night-coverage [email or web address] Password [email or web address]

## 2018-01-12 NOTE — Progress Notes (Signed)
Patient was in and out cathed only 100 mls returned. Patient was able to stand 30 minutes latter and put out over 1000 mls. No difficulty standing or moving.

## 2018-01-12 NOTE — Progress Notes (Signed)
Patient bladder was scanned and reveled about 999 mls. Attempted in and out foley only removed 100 mls patient complained of severe pain.

## 2018-01-12 NOTE — ED Triage Notes (Signed)
Pt brought in by [facility] for overdose of heroine, per EMS pt O2 sats in 80's and RR of 5, pt was given 0.5mg  of Narcan with no effect, given another 0.5mg  of Narcan and bagged pt for 5-6 mins

## 2018-01-12 NOTE — Progress Notes (Signed)
Security brought family to see the pt. Pt & Visitor where loud and disruptive. [employer] is at bedside. Pt is wanting to leave we informed him that he was IVC. [employer] has came in several times and asked them to lower their voice. Visitor was asked to leave he left and pt has calmed down.
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
## 2018-01-13 ENCOUNTER — Other Ambulatory Visit: Payer: Self-pay

## 2018-01-13 ENCOUNTER — Inpatient Hospital Stay (HOSPITAL_COMMUNITY)
Admission: AD | Admit: 2018-01-13 | Discharge: 2018-01-14 | DRG: 885 | Disposition: A | Discharge status: Home / Self Care | Payer: Federal, State, Local not specified - Other | Source: Intra-hospital | Attending: Psychiatry | Admitting: Psychiatry

## 2018-01-13 ENCOUNTER — Encounter (HOSPITAL_COMMUNITY): Payer: Self-pay

## 2018-01-13 DIAGNOSIS — F332 Major depressive disorder, recurrent severe without psychotic features: Secondary | ICD-10-CM | POA: Diagnosis present

## 2018-01-13 DIAGNOSIS — Z818 Family history of other mental and behavioral disorders: Secondary | ICD-10-CM

## 2018-01-13 DIAGNOSIS — F1210 Cannabis abuse, uncomplicated: Secondary | ICD-10-CM | POA: Diagnosis present

## 2018-01-13 DIAGNOSIS — G4700 Insomnia, unspecified: Secondary | ICD-10-CM | POA: Diagnosis present

## 2018-01-13 DIAGNOSIS — F1110 Opioid abuse, uncomplicated: Secondary | ICD-10-CM | POA: Diagnosis present

## 2018-01-13 DIAGNOSIS — F419 Anxiety disorder, unspecified: Secondary | ICD-10-CM | POA: Diagnosis present

## 2018-01-13 DIAGNOSIS — F1910 Other psychoactive substance abuse, uncomplicated: Secondary | ICD-10-CM | POA: Diagnosis present

## 2018-01-13 DIAGNOSIS — F1994 Other psychoactive substance use, unspecified with psychoactive substance-induced mood disorder: Secondary | ICD-10-CM | POA: Diagnosis not present

## 2018-01-13 DIAGNOSIS — F17210 Nicotine dependence, cigarettes, uncomplicated: Secondary | ICD-10-CM | POA: Diagnosis present

## 2018-01-13 DIAGNOSIS — F1410 Cocaine abuse, uncomplicated: Secondary | ICD-10-CM | POA: Diagnosis present

## 2018-01-13 DIAGNOSIS — F1310 Sedative, hypnotic or anxiolytic abuse, uncomplicated: Secondary | ICD-10-CM | POA: Diagnosis present

## 2018-01-13 MED ORDER — HYDROXYZINE HCL 25 MG PO TABS: 25.0000 mg | ORAL_TABLET | Freq: Four times a day (QID) | ORAL | Status: DC | PRN

## 2018-01-13 MED ORDER — CLONIDINE HCL 0.1 MG PO TABS: 0.1000 mg | ORAL_TABLET | Freq: Every day | ORAL | Status: DC

## 2018-01-13 MED ORDER — NICOTINE 21 MG/24HR TD PT24: 21.0000 mg | MEDICATED_PATCH | Freq: Every day | TRANSDERMAL | 0 refills | Status: DC

## 2018-01-13 MED ORDER — MAGNESIUM HYDROXIDE 400 MG/5ML PO SUSP: 30.0000 mL | Freq: Every day | ORAL | Status: DC | PRN

## 2018-01-13 MED ORDER — TRAZODONE HCL 100 MG PO TABS
100.0000 mg | ORAL_TABLET | Freq: Every evening | ORAL | Status: DC | PRN
  Administered 2018-01-13 (×2): 100 mg via ORAL
  Filled 2018-01-13 (×9): qty 1

## 2018-01-13 MED ORDER — NICOTINE 21 MG/24HR TD PT24
21.0000 mg | MEDICATED_PATCH | Freq: Every day | TRANSDERMAL | Status: DC
  Administered 2018-01-13 – 2018-01-14 (×2): 21 mg via TRANSDERMAL
  Filled 2018-01-13 (×4): qty 1

## 2018-01-13 MED ORDER — TRAZODONE HCL 100 MG PO TABS: 100.0000 mg | ORAL_TABLET | Freq: Every evening | ORAL | 0 refills | Status: DC | PRN

## 2018-01-13 MED ORDER — METHOCARBAMOL 500 MG PO TABS: 500.0000 mg | ORAL_TABLET | Freq: Three times a day (TID) | ORAL | Status: DC | PRN

## 2018-01-13 MED ORDER — ALUM & MAG HYDROXIDE-SIMETH 200-200-20 MG/5ML PO SUSP: 30.0000 mL | ORAL | Status: DC | PRN

## 2018-01-13 MED ORDER — PNEUMOCOCCAL VAC POLYVALENT 25 MCG/0.5ML IJ INJ: 0.5000 mL | INJECTION | INTRAMUSCULAR | Status: DC

## 2018-01-13 MED ORDER — CLONIDINE HCL 0.1 MG PO TABS
0.1000 mg | ORAL_TABLET | Freq: Four times a day (QID) | ORAL | Status: DC
  Administered 2018-01-13 – 2018-01-14 (×5): 0.1 mg via ORAL
  Filled 2018-01-13 (×10): qty 1

## 2018-01-13 MED ORDER — LORAZEPAM 1 MG PO TABS
1.0000 mg | ORAL_TABLET | Freq: Four times a day (QID) | ORAL | Status: DC | PRN
  Administered 2018-01-13: 1 mg via ORAL
  Filled 2018-01-13: qty 1

## 2018-01-13 MED ORDER — LOPERAMIDE HCL 2 MG PO CAPS: 2.0000 mg | ORAL_CAPSULE | ORAL | Status: DC | PRN

## 2018-01-13 MED ORDER — DICYCLOMINE HCL 20 MG PO TABS: 20.0000 mg | ORAL_TABLET | Freq: Four times a day (QID) | ORAL | Status: DC | PRN

## 2018-01-13 MED ORDER — NAPROXEN 500 MG PO TABS: 500.0000 mg | ORAL_TABLET | Freq: Two times a day (BID) | ORAL | Status: DC | PRN

## 2018-01-13 MED ORDER — ACETAMINOPHEN 325 MG PO TABS
650.0000 mg | ORAL_TABLET | Freq: Four times a day (QID) | ORAL | Status: DC | PRN
  Administered 2018-01-13 (×3): 650 mg via ORAL
  Filled 2018-01-13 (×3): qty 2

## 2018-01-13 MED ORDER — CLONIDINE HCL 0.1 MG PO TABS
0.1000 mg | ORAL_TABLET | ORAL | Status: DC
  Filled 2018-01-13 (×2): qty 1

## 2018-01-13 MED ORDER — ONDANSETRON 4 MG PO TBDP: 4.0000 mg | ORAL_TABLET | Freq: Four times a day (QID) | ORAL | Status: DC | PRN
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
## 2018-01-13 NOTE — [facility] Group Notes (Signed)
[facility] Group Notes:  (Nursing/MHT/Case Management/Adjunct)  Date:  01/13/2018  Time:  2:08 PM  Type of Therapy:  Psychoeducational Skills  Participation Level:  Did Not Attend  Participation Quality:  DID NOT ATTEND  Affect:  DID NOT ATTEND  Cognitive:  DID NOT ATTEND  Insight:  None  Engagement in Group:  DID NOT ATTEND  Modes of Intervention:  DID NOT ATTEND  Summary of Progress/Problems: Pt did not attend patient self inventory group.   Katria Botts O Tywone Bembenek 01/13/2018, 2:08 PM 

## 2018-01-13 NOTE — Progress Notes (Signed)
Pt had scratch behind left ear and bleeding, cleaned and pressured applied to stop the bleeding. Will continue to monitor.

## 2018-01-13 NOTE — Progress Notes (Signed)
DAR NOTE: Patient presents with anxious affect and depressed mood. Pt stated he has not slept in five, has been trying to sleep but can not fall a sleep. Pt appeared restless and anxious, pressured speech and some flight of ideas. Pt has been observed in and out of the room most of the time. Complained of headache, tylenol 650 mg PO given. Denies SI/HI, auditory and visual hallucinations.  Rates depression at 7, hopelessness at 7, and anxiety at 7.  Maintained on routine safety checks.  Medications given as prescribed.  Support and encouragement offered as needed.  Will continue to monitor.
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
## 2018-01-13 NOTE — Progress Notes (Signed)
[facility] called this RN and stated a bed is available. Patient updated. IV removed and monitoring system removed. Sheriff's department called and informed of need for transportation.
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
## 2018-01-13 NOTE — Tx Team (Signed)
Initial Treatment Plan 01/13/2018 5:54 AM Brad LeydenKeegan J Yin NFA:[id number]:[id number]    PATIENT STRESSORS: Financial difficulties Marital or family conflict   PATIENT STRENGTHS: Ability for insight Average or above average intelligence   PATIENT IDENTIFIED PROBLEMS: Depression  SI  Anxiety    "To be able to sleep 8 hours, wake up and feel like me again."             DISCHARGE CRITERIA:  Improved stabilization in mood, thinking, and/or behavior  PRELIMINARY DISCHARGE PLAN: Return to previous living arrangement  PATIENT/FAMILY INVOLVEMENT: This treatment plan has been presented to and reviewed with the patient, Brad Todd, and/or family member.  The patient and family have been given the opportunity to ask questions and make suggestions.  Floyce StakesGarrison, Dwayne Bulkley, RN 01/13/2018, 5:54 AM

## 2018-01-13 NOTE — Plan of Care (Signed)
  Problem: Coping: Goal: Ability to verbalize frustrations and anger appropriately will improve Outcome: Progressing   Problem: Safety: Goal: Periods of time without injury will increase Outcome: Progressing   Problem: Medication: Goal: Compliance with prescribed medication regimen will improve Outcome: Progressing   

## 2018-01-13 NOTE — Progress Notes (Signed)
Sheriff's department escorting patient off unit. Patient placed in paper scrubs. Belongings sent with patient. [facility] called.

## 2018-01-14 NOTE — [facility] Counselor (Signed)
Pt discharge within 48hours of admission. Psychosocial assessment not required.  Janalynn Eder S. Alan RipperHolloway, MSW, LCSW Clinical Social Worker 01/14/2018 9:27 AM

## 2018-01-14 NOTE — Progress Notes (Signed)
  [facility] :  Will you be returning to the same living situation after discharge:  Yes,  home At discharge, do you have transportation home?: Yes,  bus Do you have the ability to pay for your medications: Yes,  mental health  Release of information consent forms completed and submitted to medical records by CSW.  Patient to Follow up at: Follow-up Information    Services, [facility] Follow up on 01/16/2018.   Why:  Hospital follow-up on Wed, 8/7 at 9:00AM. Please bring the following if you have them: Photo ID, social security card, any proof of income, and hospital discharge paperwork. Thank you.  Contact information: [address] [place] [id number] [phone]           Next level of care provider has access to [facility] Link:no  Safety Planning and Suicide Prevention discussed: Yes,  SPE completed with pt; pt declined to consent to collateral contact. SPI pamphlet and Mobile Crisis information provided   Have you used any form of tobacco in the last 30 days? (Cigarettes, Smokeless Tobacco, Cigars, and/or Pipes): Yes  Has patient been referred to the Quitline?: Patient refused referral  Patient has been referred for addiction treatment: Yes  Rona RavensHeather S Demontrez Rindfleisch, LCSW 01/14/2018, 9:25 AM

## 2018-01-14 NOTE — [facility] Suicide Risk Assessment (Signed)
[facility]   Principal Problem: Substance induced mood disorder [facility](HCC) Discharge Diagnoses:  Patient Active Problem List   Diagnosis Date Noted  . MDD (major depressive disorder), recurrent episode, severe (HCC) [F33.2] 01/13/2018  . Polysubstance abuse (HCC) [F19.10] 01/13/2018  . Substance induced mood disorder (HCC) [F19.94] 01/13/2018  . Opiate overdose (HCC) [T40.601A] 01/12/2018  . Acute encephalopathy [G93.40] 01/12/2018    Total Time spent with patient: 15 minutes  Musculoskeletal: Strength & Muscle Tone: within normal limits Gait & Station: normal Patient leans: N/A  Psychiatric Specialty Exam: Review of Systems  All other systems reviewed and are negative.   Blood pressure 122/86, pulse 84, temperature 98.2 F (36.8 C), temperature source Oral, resp. rate 16, height 5\' 8"  (1.727 m), weight 63.5 kg (140 lb).Body mass index is 21.29 kg/m.  General Appearance: Casual  Eye Contact::  Fair  Speech:  Normal Rate409  Volume:  Decreased  Mood:  Euthymic  Affect:  Appropriate  Thought Process:  Coherent  Orientation:  Full (Time, Place, and Person)  Thought Content:  Logical  Suicidal Thoughts:  No  Homicidal Thoughts:  No  Memory:  Immediate;   Fair Recent;   Fair Remote;   Fair  Judgement:  Impaired  Insight:  Fair  Psychomotor Activity:  Normal  Concentration:  Fair  Recall:  [employer]  Fund of Knowledge:Fair  Language: Fair  Akathisia:  Negative  Handed:  Right  AIMS (if indicated):     Assets:  Desire for Improvement Housing Physical Health Resilience Talents/Skills  Sleep:  Number of Hours: 5.75  Cognition: WNL  ADL's:  Intact   Mental Status Per Nursing Assessment::   On Admission:  NA  Demographic Factors:  Male, Divorced or widowed, Caucasian and Low socioeconomic status  Loss Factors: NA  Historical Factors: Impulsivity  Risk Reduction Factors:   Living with another person, especially a relative  Continued  Clinical Symptoms:  Alcohol/Substance Abuse/Dependencies  Cognitive Features That Contribute To Risk:  None    Suicide Risk:  Minimal: No identifiable suicidal ideation.  Patients presenting with no risk factors but with morbid ruminations; may be classified as minimal risk based on the severity of the depressive symptoms    Plan Of Care/Follow-up recommendations:  Activity:  ad lib  Antonieta PertGreg Lawson Clary, MD 01/14/2018, 7:34 AM

## 2018-01-14 NOTE — [facility] Suicide Risk Assessment (Signed)
[facility] INPATIENT:  Family/Significant Other Suicide Prevention Education  Suicide Prevention Education:  Patient Refusal for Family/Significant Other Suicide Prevention Education: The patient Brad Todd has refused to provide written consent for family/significant other to be provided Family/Significant Other Suicide Prevention Education during admission and/or prior to discharge.  Physician notified.  SPE completed with pt, as pt refused to consent to family contact. SPI pamphlet provided to pt and pt was encouraged to share information with support network, ask questions, and talk about any concerns relating to SPE. Pt denies access to guns/firearms and verbalized understanding of information provided. Mobile Crisis information also provided to pt.   Rona RavensHeather S Adella Manolis LCSW 01/14/2018, 8:41 AM

## 2018-01-14 NOTE — Progress Notes (Signed)
Recreation Therapy Notes  Date: 8.5.19 Time: 0930 Location: [address] Dayroom  Group Topic: Stress Management  Goal Area(s) Addresses:  Patient will verbalize importance of using healthy stress management.  Patient will identify positive emotions associated with healthy stress management.   Intervention: Stress Management  Activity : Guided Imagery.  LRT introduced the stress management technique of guided imagery.  LRT read a script to lead patients on a journey through a meadow.  Patients were to follow along as script was read.  Education: Stress Management, Discharge Planning.   Education Outcome: Acknowledges edcuation/In group clarification offered/Needs additional education  Clinical Observations/Feedback: Pt did not attend group.    Lekisha Mcghee, LRT/CTRS         Corliss Coggeshall A 01/14/2018 11:23 AM 

## 2018-01-14 NOTE — Tx Team (Signed)
Interdisciplinary Treatment and Diagnostic Plan Update  01/14/2018 0830AM Brad Todd  Principal Diagnosis: Substance induced mood disorder (HCC)  Secondary Diagnoses: Principal Problem:   Substance induced mood disorder (HCC) Active Problems:   MDD (major depressive disorder), recurrent episode, severe (HCC)   Polysubstance abuse (HCC)   Current Medications:  Current Facility-Administered Medications  Medication Dose Route Frequency Provider Last Rate Last Dose  . acetaminophen (TYLENOL) tablet 650 mg  650 mg Oral Q6H PRN Kerry Hough, PA-C   650 mg at 01/13/18 1931  . alum & mag hydroxide-simeth (MAALOX/MYLANTA) 200-200-20 MG/5ML suspension 30 mL  30 mL Oral Q4H PRN Kerry Hough, PA-C      . cloNIDine (CATAPRES) tablet 0.1 mg  0.1 mg Oral QID Donell Sievert E, PA-C   0.1 mg at 01/14/18 0805   Followed by  . [START ON 01/15/2018] cloNIDine (CATAPRES) tablet 0.1 mg  0.1 mg Oral BH-qamhs Simon, Spencer E, PA-C       Followed by  . [START ON 01/18/2018] cloNIDine (CATAPRES) tablet 0.1 mg  0.1 mg Oral QAC breakfast Kerry Hough, PA-C      . dicyclomine (BENTYL) tablet 20 mg  20 mg Oral Q6H PRN Kerry Hough, PA-C      . hydrOXYzine (ATARAX/VISTARIL) tablet 25 mg  25 mg Oral Q6H PRN Donell Sievert E, PA-C      . loperamide (IMODIUM) capsule 2-4 mg  2-4 mg Oral PRN Kerry Hough, PA-C      . LORazepam (ATIVAN) tablet 1 mg  1 mg Oral Q6H PRN Kerry Hough, PA-C   1 mg at 01/13/18 0423  . magnesium hydroxide (MILK OF MAGNESIA) suspension 30 mL  30 mL Oral Daily PRN Donell Sievert E, PA-C      . methocarbamol (ROBAXIN) tablet 500 mg  500 mg Oral Q8H PRN Donell Sievert E, PA-C      . naproxen (NAPROSYN) tablet 500 mg  500 mg Oral BID PRN Donell Sievert E, PA-C      . nicotine (NICODERM CQ - dosed in mg/24 hours) patch 21 mg  21 mg Transdermal Daily Antonieta Pert, MD   21 mg at 01/14/18 0804  . ondansetron (ZOFRAN-ODT) disintegrating tablet 4 mg  4 mg  Oral Q6H PRN Donell Sievert E, PA-C      . pneumococcal 23 valent vaccine (PNU-IMMUNE) injection 0.5 mL  0.5 mL Intramuscular Tomorrow-1000 Antonieta Pert, MD      . traZODone (DESYREL) tablet 100 mg  100 mg Oral QHS,MR X 1 Kerry Hough, PA-C   100 mg at 01/13/18 2230   PTA Medications: No medications prior to admission.    Patient Stressors: Financial difficulties Marital or family conflict  Patient Strengths: Ability for insight Average or above average intelligence  Treatment Modalities: Medication Management, Group therapy, Case management,  1 to 1 session with clinician, Psychoeducation, Recreational therapy.   Physician Treatment Plan for Primary Diagnosis: Substance induced mood disorder (HCC) Long Term Goal(s): Improvement in symptoms so as ready for discharge Improvement in symptoms so as ready for discharge   Short Term Goals: Ability to maintain clinical measurements within normal limits will improve Compliance with prescribed medications will improve Ability to identify triggers associated with substance abuse/mental health issues will improve Ability to verbalize feelings will improve Ability to demonstrate self-control will improve  Medication Management: Evaluate patient's response, side effects, and tolerance of medication regimen.  Therapeutic Interventions: 1 to 1 sessions, Unit Group sessions and  Medication administration.  Evaluation of Outcomes: Adequate for Discharge  Physician Treatment Plan for Secondary Diagnosis: Principal Problem:   Substance induced mood disorder (HCC) Active Problems:   MDD (major depressive disorder), recurrent episode, severe (HCC)   Polysubstance abuse (HCC)  Long Term Goal(s): Improvement in symptoms so as ready for discharge Improvement in symptoms so as ready for discharge   Short Term Goals: Ability to maintain clinical measurements within normal limits will improve Compliance with prescribed medications will  improve Ability to identify triggers associated with substance abuse/mental health issues will improve Ability to verbalize feelings will improve Ability to demonstrate self-control will improve     Medication Management: Evaluate patient's response, side effects, and tolerance of medication regimen.  Therapeutic Interventions: 1 to 1 sessions, Unit Group sessions and Medication administration.  Evaluation of Outcomes: Adequate for Discharge   RN Treatment Plan for Primary Diagnosis: Substance induced mood disorder (HCC) Long Term Goal(s): Knowledge of disease and therapeutic regimen to maintain health will improve  Short Term Goals: Ability to remain free from injury will improve, Ability to demonstrate self-control and Ability to verbalize feelings will improve  Medication Management: RN will administer medications as ordered by provider, will assess and evaluate patient's response and provide education to patient for prescribed medication. RN will report any adverse and/or side effects to prescribing provider.  Therapeutic Interventions: 1 on 1 counseling sessions, Psychoeducation, Medication administration, Evaluate responses to treatment, Monitor vital signs and CBGs as ordered, Perform/monitor CIWA, COWS, AIMS and Fall Risk screenings as ordered, Perform wound care treatments as ordered.  Evaluation of Outcomes: Adequate for Discharge   LCSW Treatment Plan for Primary Diagnosis: Substance induced mood disorder (HCC) Long Term Goal(s): Safe transition to appropriate next level of care at discharge, Engage patient in therapeutic group addressing interpersonal concerns.  Short Term Goals: Engage patient in aftercare planning with referrals and resources, Facilitate patient progression through stages of change regarding substance use diagnoses and concerns and Identify triggers associated with mental health/substance abuse issues  Therapeutic Interventions: Assess for all discharge  needs, 1 to 1 time with Social worker, Explore available resources and support systems, Assess for adequacy in community support network, Educate family and significant other(s) on suicide prevention, Complete Psychosocial Assessment, Interpersonal group therapy.  Evaluation of Outcomes: Adequate for Discharge   Progress in Treatment: Attending groups: No. Participating in groups: No. Taking medication as prescribed: Yes. Toleration medication: Yes. Family/Significant other contact made: SPE completed with pt; pt declined to consent to collateral contact.  Patient understands diagnosis: Yes. Discussing patient identified problems/goals with staff: Yes. Medical problems stabilized or resolved: Yes. Denies suicidal/homicidal ideation: Yes. Issues/concerns per patient self-inventory: No. Other: n/a   New problem(s) identified: No, Describe:  n/a  New Short Term/Long Term Goal(s): detox, medication management for mood stabilization; elimination of SI thoughts; development of comprehensive mental wellness/sobriety plan.   Patient Goals:  "To be able to sleep well."   Discharge Plan or Barriers: Pt is planning to discharge today. CSW assessing for appropriate referrals--[facility]. [facility] pamphlet, Mobile Crisis information, and AA/NA information provided to patient for additional community support and resources.   Reason for Continuation of Hospitalization: none  Estimated Length of Stay: today, 01/14/18  Attendees: Patient: 01/14/2018 8:39 AM  Physician: Dr. Altamese Carolinaainville MD; Dr. Jola Babinskilary MD  01/14/2018 8:39 AM  Nursing: Moshe SalisburyMichael RN; ShastaBeverly RN 01/14/2018 8:39 AM  RN Care Manager:x 01/14/2018 8:39 AM  Social Worker: Corrie MckusickHeather Jaila Schellhorn LCSW 01/14/2018 8:39 AM  Recreational Therapist: x 01/14/2018 8:39  AM  Other: Hillery Jacks NP; Renold Don NP 01/14/2018 8:39 AM  Other:  01/14/2018 8:39 AM  Other: 01/14/2018 8:39 AM    Scribe for Treatment Team: Rona Ravens, LCSW 01/14/2018 8:39 AM

## 2018-01-14 NOTE — Plan of Care (Signed)
Patient verbalizes readiness for discharge. Follow up plan explained, AVS, Transition record and SRA given. Prescriptions and teaching provided. Belongings returned and signed for. Suicide safety plan completed and signed. Patient verbalizes understanding. 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 Patient discharged to lobby with bus pass.  Problem: Education: Goal: Knowledge of  General Education information/materials will improve Outcome: Adequate for Discharge Goal: Emotional status will improve Outcome: Adequate for Discharge Goal: Mental status will improve Outcome: Adequate for Discharge Goal: Verbalization of understanding the information provided will improve Outcome: Adequate for Discharge   Problem: Activity: Goal: Interest or engagement in activities will improve Outcome: Adequate for Discharge Goal: Sleeping patterns will improve Outcome: Adequate for Discharge   Problem: Coping: Goal: Ability to verbalize frustrations and anger appropriately will improve Outcome: Adequate for Discharge Goal: Ability to demonstrate self-control will improve Outcome: Adequate for Discharge   Problem: Health Behavior/Discharge Planning: Goal: Identification of resources available to assist in meeting health care needs will improve Outcome: Adequate for Discharge Goal: Compliance with treatment plan for underlying cause of condition will improve Outcome: Adequate for Discharge   Problem: Physical Regulation: Goal: Ability to maintain clinical measurements within normal limits will improve Outcome: Adequate for Discharge   Problem: Safety: Goal: Periods of time without injury will increase Outcome: Adequate for Discharge   Problem: Education: Goal: Utilization of techniques to improve thought processes will improve Outcome: Adequate for Discharge Goal: Knowledge of the prescribed therapeutic regimen will improve Outcome:  Adequate for Discharge   Problem: Activity: Goal: Interest or engagement in leisure activities will improve Outcome: Adequate for Discharge Goal: Imbalance in normal sleep/wake cycle will improve Outcome: Adequate for Discharge   Problem: Coping: Goal: Coping ability will improve Outcome: Adequate for Discharge Goal: Will verbalize feelings Outcome: Adequate for Discharge   Problem: Health Behavior/Discharge Planning: Goal: Ability to make decisions will improve Outcome: Adequate for Discharge Goal: Compliance with therapeutic regimen will improve Outcome: Adequate for Discharge   Problem: Role Relationship: Goal: Will demonstrate positive changes in social behaviors and relationships Outcome: Adequate for Discharge   Problem: Safety: Goal: Ability to disclose and discuss suicidal ideas will improve Outcome: Adequate for Discharge Goal: Ability to identify and utilize support systems that promote safety will improve Outcome: Adequate for Discharge   Problem: Self-Concept: Goal: Will verbalize positive feelings about self Outcome: Adequate for Discharge Goal: Level of anxiety will decrease Outcome: Adequate for Discharge   Problem: Education: Goal: Ability to state activities that reduce stress will improve Outcome: Adequate for Discharge   Problem: Coping: Goal: Ability to identify and develop effective coping behavior will improve Outcome: Adequate for Discharge   Problem: Self-Concept: Goal: Ability to identify factors that promote anxiety will improve Outcome: Adequate for Discharge Goal: Level of anxiety will decrease Outcome: Adequate for Discharge Goal: Ability to modify response to factors that promote anxiety will improve Outcome: Adequate for Discharge   Problem: Education: Goal: Ability to make informed decisions regarding treatment will improve Outcome: Adequate for Discharge   Problem: Coping: Goal: Coping ability will improve Outcome: Adequate  for Discharge   Problem: Health Behavior/Discharge Planning: Goal: Identification of resources available to assist in meeting health care needs will improve Outcome: Adequate for Discharge   Problem: Medication: Goal: Compliance with prescribed medication regimen will improve Outcome: Adequate for Discharge   Problem: Self-Concept: Goal: Ability to disclose and discuss suicidal ideas will improve Outcome: Adequate for Discharge Goal: Will verbalize positive feelings about self Outcome:  Adequate for Discharge

## 2018-01-18 ENCOUNTER — Encounter (HOSPITAL_COMMUNITY): Payer: Self-pay | Admitting: Emergency Medicine

## 2018-01-18 ENCOUNTER — Other Ambulatory Visit: Payer: Self-pay

## 2018-01-18 ENCOUNTER — Emergency Department (HOSPITAL_COMMUNITY)
Admission: EM | Admit: 2018-01-18 | Discharge: 2018-01-19 | Payer: Self-pay | Attending: Emergency Medicine | Admitting: Emergency Medicine

## 2018-01-18 DIAGNOSIS — F17210 Nicotine dependence, cigarettes, uncomplicated: Secondary | ICD-10-CM | POA: Insufficient documentation

## 2018-01-18 DIAGNOSIS — F1910 Other psychoactive substance abuse, uncomplicated: Secondary | ICD-10-CM

## 2018-01-18 DIAGNOSIS — R401 Stupor: Secondary | ICD-10-CM

## 2018-01-18 DIAGNOSIS — F1310 Sedative, hypnotic or anxiolytic abuse, uncomplicated: Secondary | ICD-10-CM

## 2018-01-18 DIAGNOSIS — F13120 Sedative, hypnotic or anxiolytic abuse with intoxication, uncomplicated: Secondary | ICD-10-CM | POA: Insufficient documentation

## 2018-01-18 DIAGNOSIS — I959 Hypotension, unspecified: Secondary | ICD-10-CM

## 2018-01-18 LAB — COMPREHENSIVE METABOLIC PANEL
ALBUMIN: 4.5 g/dL (ref 3.5–5.0)
ALK PHOS: 71 U/L (ref 38–126)
ALT: 25 U/L (ref 0–44)
AST: 18 U/L (ref 15–41)
Anion gap: 8 (ref 5–15)
BUN: 15 mg/dL (ref 6–20)
CALCIUM: 9.7 mg/dL (ref 8.9–10.3)
CO2: 25 mmol/L (ref 22–32)
CREATININE: 0.9 mg/dL (ref 0.61–1.24)
Chloride: 109 mmol/L (ref 98–111)
GFR calc Af Amer: >60 mL/min (ref >60)
GFR calc non Af Amer: >60 mL/min (ref >60)
GLUCOSE: 92 mg/dL (ref 70–99)
Potassium: 3.9 mmol/L (ref 3.5–5.1)
Sodium: 142 mmol/L (ref 135–145)
TOTAL PROTEIN: 7.8 g/dL (ref 6.5–8.1)
Total Bilirubin: 0.7 mg/dL (ref 0.3–1.2)

## 2018-01-18 LAB — CBC
HCT: 44 % (ref 39.0–52.0)
Hemoglobin: 15.1 g/dL (ref 13.0–17.0)
MCH: 29.3 pg (ref 26.0–34.0)
MCHC: 34.3 g/dL (ref 30.0–36.0)
MCV: 85.4 fL (ref 78.0–100.0)
PLATELETS: 254 10*3/uL (ref 150–400)
RBC: 5.15 MIL/uL (ref 4.22–5.81)
RDW: 13.2 % (ref 11.5–15.5)
WBC: 9.4 10*3/uL (ref 4.0–10.5)

## 2018-01-18 LAB — ACETAMINOPHEN LEVEL

## 2018-01-18 LAB — ETHANOL: Alcohol, Ethyl (B): <10 mg/dL (ref <10)

## 2018-01-18 LAB — SALICYLATE LEVEL: Salicylate Lvl: <7 mg/dL (ref 2.8–30.0)

## 2018-01-18 MED ORDER — SODIUM CHLORIDE 0.9 % IV BOLUS
1000.0000 mL | Freq: Once | INTRAVENOUS | Status: AC
  Administered 2018-01-18: 1000 mL via INTRAVENOUS

## 2018-01-18 MED ORDER — NALOXONE HCL 2 MG/2ML IJ SOSY
1.0000 mg | PREFILLED_SYRINGE | Freq: Once | INTRAMUSCULAR | Status: AC
  Administered 2018-01-18: 1 mg via INTRAVENOUS
  Filled 2018-01-18: qty 2
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
## 2018-01-19 LAB — RAPID URINE DRUG SCREEN, HOSP PERFORMED
Amphetamines: NOT DETECTED
Barbiturates: NOT DETECTED
Benzodiazepines: POSITIVE — AB
COCAINE: NOT DETECTED
OPIATES: NOT DETECTED
TETRAHYDROCANNABINOL: NOT DETECTED

## 2018-01-19 NOTE — Discharge Instructions (Addendum)
You need to consider getting help with your drug addiction problems. Look at the information to get help locally.   PT CLEARED TO GO TO JAIL
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
## 2018-08-10 ENCOUNTER — Encounter (HOSPITAL_COMMUNITY): Payer: Self-pay

## 2018-08-10 ENCOUNTER — Emergency Department (HOSPITAL_COMMUNITY)
Admission: EM | Admit: 2018-08-10 | Discharge: 2018-08-10 | Disposition: A | Discharge status: Home / Self Care | Payer: 59 | Attending: Emergency Medicine | Admitting: Emergency Medicine

## 2018-08-10 ENCOUNTER — Other Ambulatory Visit: Payer: Self-pay

## 2018-08-10 DIAGNOSIS — T40601A Poisoning by unspecified narcotics, accidental (unintentional), initial encounter: Secondary | ICD-10-CM | POA: Insufficient documentation

## 2018-08-10 DIAGNOSIS — Y929 Unspecified place or not applicable: Secondary | ICD-10-CM | POA: Insufficient documentation

## 2018-08-10 DIAGNOSIS — Y999 Unspecified external cause status: Secondary | ICD-10-CM | POA: Insufficient documentation

## 2018-08-10 DIAGNOSIS — F17210 Nicotine dependence, cigarettes, uncomplicated: Secondary | ICD-10-CM | POA: Insufficient documentation

## 2018-08-10 DIAGNOSIS — Z79899 Other long term (current) drug therapy: Secondary | ICD-10-CM | POA: Insufficient documentation

## 2018-08-10 DIAGNOSIS — Y9389 Activity, other specified: Secondary | ICD-10-CM | POA: Insufficient documentation

## 2018-08-10 MED ORDER — IBUPROFEN 400 MG PO TABS
600.0000 mg | ORAL_TABLET | Freq: Once | ORAL | Status: AC
Start: 2018-08-10 — End: 2018-08-10
  Administered 2018-08-10: 600 mg via ORAL
  Filled 2018-08-10: qty 2

## 2018-08-10 NOTE — Discharge Instructions (Addendum)
Stay away from the narcotic pills and street drugs!!!!  Consider going to [facility] or look at the outpatient resource guide to get outpatient treatment.

## 2018-08-10 NOTE — ED Notes (Signed)
Pt on 2 L of oxygen because oxygen saturation was in the high 80s
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
## 2018-08-10 NOTE — ED Triage Notes (Signed)
Pt in by [facility] after apparent overdose, given narcan with improvement. Pt is awake and alert now, states he has a severe headache.  Pt admits to taking " a pill of some kind"   Pt denies iv drug use, admits to etoh use tonight as well.

## 2019-08-29 ENCOUNTER — Other Ambulatory Visit: Payer: Self-pay

## 2019-08-29 ENCOUNTER — Encounter (HOSPITAL_COMMUNITY): Payer: Self-pay

## 2019-08-29 ENCOUNTER — Emergency Department (HOSPITAL_COMMUNITY)
Admission: EM | Admit: 2019-08-29 | Discharge: 2019-08-29 | Disposition: A | Discharge status: Home / Self Care | Payer: Self-pay | Attending: Emergency Medicine | Admitting: Emergency Medicine

## 2019-08-29 DIAGNOSIS — Y939 Activity, unspecified: Secondary | ICD-10-CM | POA: Insufficient documentation

## 2019-08-29 DIAGNOSIS — S31010A Laceration without foreign body of lower back and pelvis without penetration into retroperitoneum, initial encounter: Secondary | ICD-10-CM | POA: Insufficient documentation

## 2019-08-29 DIAGNOSIS — Y999 Unspecified external cause status: Secondary | ICD-10-CM | POA: Insufficient documentation

## 2019-08-29 DIAGNOSIS — F17210 Nicotine dependence, cigarettes, uncomplicated: Secondary | ICD-10-CM | POA: Insufficient documentation

## 2019-08-29 DIAGNOSIS — Y929 Unspecified place or not applicable: Secondary | ICD-10-CM | POA: Insufficient documentation

## 2019-08-29 DIAGNOSIS — Z79899 Other long term (current) drug therapy: Secondary | ICD-10-CM | POA: Insufficient documentation
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
## 2019-08-29 NOTE — ED Triage Notes (Signed)
Pt as a superficial laceration to the top of is left buttock which happened approx 1 hour ago.  Pt denies pain to the laceration, but states his lower back hurts due to when he fell forward and states his back was "arched".

## 2019-08-29 NOTE — Discharge Instructions (Addendum)
Clean area twice a day with soap and water.  Take Tylenol Motrin for pain.  Follow-up with your family doctor or get seen in urgent care to have the staples removed in 7 to 10 days

## 2019-12-13 ENCOUNTER — Emergency Department (HOSPITAL_COMMUNITY)
Admission: EM | Admit: 2019-12-13 | Discharge: 2019-12-13 | Disposition: A | Discharge status: Home / Self Care | Payer: Self-pay | Attending: Emergency Medicine | Admitting: Emergency Medicine

## 2019-12-13 ENCOUNTER — Other Ambulatory Visit: Payer: Self-pay

## 2019-12-13 ENCOUNTER — Encounter (HOSPITAL_COMMUNITY): Payer: Self-pay | Admitting: Emergency Medicine

## 2019-12-13 DIAGNOSIS — R21 Rash and other nonspecific skin eruption: Secondary | ICD-10-CM | POA: Insufficient documentation

## 2019-12-13 DIAGNOSIS — F17210 Nicotine dependence, cigarettes, uncomplicated: Secondary | ICD-10-CM | POA: Insufficient documentation

## 2019-12-13 DIAGNOSIS — Z760 Encounter for issue of repeat prescription: Secondary | ICD-10-CM | POA: Insufficient documentation

## 2019-12-13 MED ORDER — PERMETHRIN 5 % EX CREA: TOPICAL_CREAM | CUTANEOUS | 1 refills | Status: DC

## 2019-12-13 MED ORDER — DOXYCYCLINE HYCLATE 100 MG PO CAPS: 100.0000 mg | ORAL_CAPSULE | Freq: Two times a day (BID) | ORAL | 0 refills | Status: AC

## 2019-12-13 MED ORDER — ACYCLOVIR 400 MG PO TABS: 400.0000 mg | ORAL_TABLET | Freq: Two times a day (BID) | ORAL | 0 refills | Status: AC

## 2019-12-13 NOTE — ED Triage Notes (Signed)
Pt was just left out of jail. Now c/o of generalized rash on body

## 2019-12-13 NOTE — Discharge Instructions (Signed)
You may have diarrhea from the antibiotics.  It is very important that you continue to take the antibiotics even if you get diarrhea unless a medical professional tells you that you may stop taking them.  If you stop too early the bacteria you are being treated for will become stronger and you may need different, more powerful antibiotics that have more side effects and worsening diarrhea.  Please stay well hydrated and consider probiotics as they may decrease the severity of your diarrhea.   Doxycycline will make you likely to sunburn.  Please use caution.  I have given you a refill prescription for acyclovir.  Please follow-up with your primary care doctor.  If you have any lesions that do not resolve or worsen, you develop fevers or other concerns please seek additional medical care and evaluation.

## 2019-12-13 NOTE — ED Provider Notes (Signed)
[facility] EMERGENCY DEPARTMENT Provider Note   CSN: [id number] Arrival date & time: 12/13/19  1511     History Chief Complaint  Patient presents with  . Rash    Brad Todd is a 29 y.o. male with a past medical history of polysubstance abuse, major depressive disorder, who presents today for evaluation of a rash.  He states that he just got out of jail and over the past few days has had a rash on his body.  He states that his bunk mate had growths on his arms however he was unsure what they were.  He states that the rash itches and is keeping him awake at night.  He denies any fevers.  He states that now the rash is spread to his girlfriend, is on her inner thighs.   He reports that he has run out of his acyclovir that he takes for "mouth ulcers" and that since then the ulcers have been bothering him "for a while".  He states that they are usual for him, unrelated to the rash and denies any abnormalities with the ulcers.  He is requesting a rx for acyclovir to get him by until he can see his PCP.    He denies any penile discharge.    HPI     Past Medical History:  Diagnosis Date  . Chronic shoulder pain   . Polysubstance abuse (HCC)    cocaine, opiates, benzos  . Rhabdomyolysis 2011   accutane induced    Patient Active Problem List   Diagnosis Date Noted  . MDD (major depressive disorder), recurrent episode, severe (HCC) 01/13/2018  . Polysubstance abuse (HCC) 01/13/2018  . Substance induced mood disorder (HCC) 01/13/2018  . Opiate overdose (HCC) 01/12/2018  . Acute encephalopathy 01/12/2018    Past Surgical History:  Procedure Laterality Date  . ORIF PROXIMAL HUMERUS FRACTURE  05/2014       History reviewed. No pertinent family history.  Social History   Tobacco Use  . Smoking status: Current Every Day Smoker    Packs/day: 1.50    Types: Cigarettes  . Smokeless tobacco: Never Used  Vaping Use  . Vaping Use: Never used  Substance Use Topics  .  Alcohol use: Yes    Alcohol/week: 2.0 standard drinks    Types: 2 Cans of beer per week    Comment: daily  . Drug use: Not Currently    Types: IV    Comment: heroin    Home Medications Prior to Admission medications   Medication Sig Start Date End Date Taking? Authorizing Provider  acyclovir (ZOVIRAX) 400 MG tablet Take 1 tablet (400 mg total) by mouth 2 (two) times daily for 14 days. 12/13/19 12/27/19  Cristina Gong, PA-C  doxycycline (VIBRAMYCIN) 100 MG capsule Take 1 capsule (100 mg total) by mouth 2 (two) times daily for 7 days. 12/13/19 12/20/19  Cristina Gong, PA-C  nicotine (NICODERM CQ - DOSED IN MG/24 HOURS) 21 mg/24hr patch Place 1 patch (21 mg total) onto the skin daily. 01/14/18   Oneta Rack, NP  permethrin (ELIMITE) 5 % cream Apply to entire body (except face) once, leave on for 10 hours then shower off.  Repeat in one week. 12/13/19   Cristina Gong, PA-C  traZODone (DESYREL) 100 MG tablet Take 1 tablet (100 mg total) by mouth at bedtime and may repeat dose one time if needed. 01/13/18   Oneta Rack, NP    Allergies    Patient has no  known allergies.  Review of Systems   Review of Systems  Constitutional: Negative for chills and fever.  HENT: Negative for congestion.   Eyes: Negative for visual disturbance.  Respiratory: Negative for shortness of breath.   Gastrointestinal: Negative for abdominal pain.  Genitourinary: Negative for dysuria, penile pain, scrotal swelling and testicular pain.  Musculoskeletal: Negative for back pain.  Skin: Positive for rash.  Neurological: Negative for weakness and headaches.  All other systems reviewed and are negative.   Physical Exam Updated Vital Signs BP 120/83 (BP Location: Right Arm)   Pulse 92   Temp 98.2 F (36.8 C) (Oral)   Resp 18   Ht 5\' 7"  (1.702 m)   Wt 72.6 kg   SpO2 99%   BMI 25.06 kg/m   Physical Exam Vitals and nursing note reviewed.  Constitutional:      General: He is not in acute  distress.    Appearance: He is well-developed. He is not diaphoretic.  HENT:     Head: Normocephalic and atraumatic.  Eyes:     General: No scleral icterus.       Right eye: No discharge.        Left eye: No discharge.     Conjunctiva/sclera: Conjunctivae normal.  Cardiovascular:     Rate and Rhythm: Normal rate.  Pulmonary:     Effort: Pulmonary effort is normal.  Abdominal:     General: There is no distension.  Musculoskeletal:        General: No deformity.     Cervical back: Normal range of motion.  Skin:    General: Skin is warm and dry.     Comments: There is a maculopapular rash, primarily only chest, neck, upper back and head with lesions additionally on bilateral arms and legs.  There is secondary excoriation with multiple lesions with transformation to a pustule.  No significant induration or fluctuance and any other lesions palpated.  No lesions in the webspaces between fingers.   Neurological:     Mental Status: He is alert.     Motor: No abnormal muscle tone.  Psychiatric:        Mood and Affect: Mood normal.        Behavior: Behavior normal.     ED Results / Procedures / Treatments   Labs (all labs ordered are listed, but only abnormal results are displayed) Labs Reviewed - No data to display  EKG None  Radiology No results found.  Procedures Procedures (including critical care time)  Medications Ordered in ED Medications - No data to display  ED Course  I have reviewed the triage vital signs and the nursing notes.  Pertinent labs & imaging results that were available during my care of the patient were reviewed by me and considered in my medical decision making (see chart for details).    MDM Rules/Calculators/A&P                         Patient is a 29 year old man who just got out of jail who presents today for evaluation of a rash primarily on his torso however he also has lesions on his arms, legs, neck and head.  On exam he appears to have  secondary excoriation with occasional lesions changing from maculopapular into pustules concerning for secondary bacterial infection.  I suspect he either has a folliculitis or scabies, however given his rash is now spread to his girlfriend I suspect scabies especially given his recent  release from incarceration.  We will treat with permethrin for scabies and doxycycline for secondary bacterial infection.  He requested a refill on his acyclovir, he states that he is mouth ulcers that he gets are unrelated to his new rash, he does not wish for those to be evaluated today simply wishes for refill until he can get to his primary care doctor.  He is given refill on his chronic suppressive therapy.  Given that the rash has spread to his girlfriend they do not suspect SJS or other similar rash.    He is afebrile and generally well appearing.   Importance of treatment for scabies beyond the cream including environmental control was discussed.    Return precautions were discussed with patient who states their understanding.  At the time of discharge patient denied any unaddressed complaints or concerns.  Patient is agreeable for discharge home.  Note: Portions of this report may have been transcribed using voice recognition software. Every effort was made to ensure accuracy; however, inadvertent computerized transcription errors may be present   Final Clinical Impression(s) / ED Diagnoses Final diagnoses:  Rash  Encounter for medication refill    Rx / DC Orders ED Discharge Orders         Ordered    permethrin (ELIMITE) 5 % cream     Discontinue  Reprint     12/13/19 1607    doxycycline (VIBRAMYCIN) 100 MG capsule  2 times daily     Discontinue  Reprint     12/13/19 1607    acyclovir (ZOVIRAX) 400 MG tablet  2 times daily     Discontinue  Reprint     12/13/19 1607           Cristina Gong, [place] 12/13/19 1621    Terald Sleeper, MD 12/13/19 [phone]

## 2020-05-30 ENCOUNTER — Encounter (HOSPITAL_COMMUNITY): Payer: Self-pay | Admitting: Emergency Medicine

## 2020-05-30 ENCOUNTER — Emergency Department (HOSPITAL_COMMUNITY)
Admission: EM | Admit: 2020-05-30 | Discharge: 2020-05-30 | Disposition: A | Discharge status: Home / Self Care | Payer: Self-pay | Attending: Emergency Medicine | Admitting: Emergency Medicine

## 2020-05-30 ENCOUNTER — Other Ambulatory Visit: Payer: Self-pay

## 2020-05-30 DIAGNOSIS — F17210 Nicotine dependence, cigarettes, uncomplicated: Secondary | ICD-10-CM | POA: Insufficient documentation

## 2020-05-30 DIAGNOSIS — F19982 Other psychoactive substance use, unspecified with psychoactive substance-induced sleep disorder: Secondary | ICD-10-CM

## 2020-05-30 DIAGNOSIS — Z79899 Other long term (current) drug therapy: Secondary | ICD-10-CM | POA: Insufficient documentation

## 2020-05-30 DIAGNOSIS — R208 Other disturbances of skin sensation: Secondary | ICD-10-CM

## 2020-05-30 DIAGNOSIS — F419 Anxiety disorder, unspecified: Secondary | ICD-10-CM

## 2020-05-30 LAB — COMPREHENSIVE METABOLIC PANEL
ALT: 213 U/L — ABNORMAL HIGH (ref 0–44)
AST: 94 U/L — ABNORMAL HIGH (ref 15–41)
Albumin: 4.3 g/dL (ref 3.5–5.0)
Alkaline Phosphatase: 86 U/L (ref 38–126)
Anion gap: 8 (ref 5–15)
BUN: 8 mg/dL (ref 6–20)
CO2: 27 mmol/L (ref 22–32)
Calcium: 9.3 mg/dL (ref 8.9–10.3)
Chloride: 105 mmol/L (ref 98–111)
Creatinine, Ser: 0.81 mg/dL (ref 0.61–1.24)
GFR, Estimated: >60 mL/min (ref >60)
Glucose, Bld: 157 mg/dL — ABNORMAL HIGH (ref 70–99)
Potassium: 3.2 mmol/L — ABNORMAL LOW (ref 3.5–5.1)
Sodium: 140 mmol/L (ref 135–145)
Total Bilirubin: 0.3 mg/dL (ref 0.3–1.2)
Total Protein: 7.5 g/dL (ref 6.5–8.1)

## 2020-05-30 LAB — CBC
HCT: 39.2 % (ref 39.0–52.0)
Hemoglobin: 13.2 g/dL (ref 13.0–17.0)
MCH: 29.4 pg (ref 26.0–34.0)
MCHC: 33.7 g/dL (ref 30.0–36.0)
MCV: 87.3 fL (ref 80.0–100.0)
Platelets: 229 10*3/uL (ref 150–400)
RBC: 4.49 MIL/uL (ref 4.22–5.81)
RDW: 13.2 % (ref 11.5–15.5)
WBC: 8 10*3/uL (ref 4.0–10.5)
nRBC: 0 % (ref 0.0–0.2)

## 2020-05-30 LAB — ETHANOL: Alcohol, Ethyl (B): <10 mg/dL (ref <10)

## 2020-05-30 MED ORDER — POTASSIUM CHLORIDE CRYS ER 20 MEQ PO TBCR
40.0000 meq | EXTENDED_RELEASE_TABLET | Freq: Once | ORAL | Status: AC
  Administered 2020-05-30: 40 meq via ORAL
  Filled 2020-05-30: qty 2

## 2020-05-30 MED ORDER — HYDROXYZINE HCL 25 MG PO TABS: 25.0000 mg | ORAL_TABLET | Freq: Three times a day (TID) | ORAL | 0 refills | Status: DC | PRN

## 2020-05-30 NOTE — ED Triage Notes (Addendum)
Pt states he did meth about a month and half ago and "hasnt been the same since". Pt states he had a "bad experience while high on meth". Pt also states he did "powder" a week ago. Pt c/o unable to sleep, hearing tapping on things, hearing voices. Pt states this is unusal than when he usually does meth. Pt states he feels like hes on a "high still and he hasnt done drugs in a while." pt also states hes detoxing from suboxone.

## 2020-05-30 NOTE — ED Provider Notes (Signed)
[facility] EMERGENCY DEPARTMENT Provider Note   CSN: [id number] Arrival date & time: 05/30/20  0730     History Chief Complaint  Patient presents with  . multiple complaints    Brad Todd is a 29 y.o. male.  Patient c/o feelings of anxiety, restlessness, trouble sleeping at night, for the past couple months. States he used meth about a month ago and wonders whether that is related. Denies any visual hallucinations, states occasionally at night he will hear a tapping sound that isnt there. States took a dose of suboxone this past week, but denies any regular/daily prescription med use. Denies other recent substance abuse although has hx polysubstance abuse/heroin abuse. Denies depression or thoughts of self harm, no SI. States stressors related to work, past relationship. Normal appetite. No wt loss. Denies any acute physical health symptoms. No uri symptoms. No headache. No cp or sob. No abd pain or nvd.   The history is provided by the patient.       Past Medical History:  Diagnosis Date  . Chronic shoulder pain   . Polysubstance abuse (HCC)    cocaine, opiates, benzos  . Rhabdomyolysis 2011   accutane induced    Patient Active Problem List   Diagnosis Date Noted  . MDD (major depressive disorder), recurrent episode, severe (HCC) 01/13/2018  . Polysubstance abuse (HCC) 01/13/2018  . Substance induced mood disorder (HCC) 01/13/2018  . Opiate overdose (HCC) 01/12/2018  . Acute encephalopathy 01/12/2018    Past Surgical History:  Procedure Laterality Date  . ORIF PROXIMAL HUMERUS FRACTURE  05/2014       History reviewed. No pertinent family history.  Social History   Tobacco Use  . Smoking status: Current Every Day Smoker    Packs/day: 1.50    Types: Cigarettes  . Smokeless tobacco: Never Used  Vaping Use  . Vaping Use: Never used  Substance Use Topics  . Alcohol use: Yes    Alcohol/week: 2.0 standard drinks    Types: 2 Cans of beer per week     Comment: daily  . Drug use: Yes    Types: Marijuana, Methamphetamines, Cocaine    Comment: heroin    Home Medications Prior to Admission medications   Medication Sig Start Date End Date Taking? Authorizing Provider  nicotine (NICODERM CQ - DOSED IN MG/24 HOURS) 21 mg/24hr patch Place 1 patch (21 mg total) onto the skin daily. 01/14/18   Oneta Rack, NP  permethrin (ELIMITE) 5 % cream Apply to entire body (except face) once, leave on for 10 hours then shower off.  Repeat in one week. 12/13/19   Cristina Gong, PA-C  traZODone (DESYREL) 100 MG tablet Take 1 tablet (100 mg total) by mouth at bedtime and may repeat dose one time if needed. 01/13/18   Oneta Rack, NP    Allergies    Patient has no known allergies.  Review of Systems   Review of Systems  Constitutional: Negative for chills and fever.  HENT: Negative for sore throat.   Eyes: Negative for redness.  Respiratory: Negative for cough and shortness of breath.   Cardiovascular: Negative for chest pain.  Gastrointestinal: Negative for abdominal pain, nausea and vomiting.  Genitourinary: Negative for flank pain.  Musculoskeletal: Negative for back pain and neck pain.  Skin: Negative for rash.  Neurological: Negative for headaches.  Hematological: Does not bruise/bleed easily.  Psychiatric/Behavioral: Positive for sleep disturbance. Negative for suicidal ideas. The patient is nervous/anxious.     Physical Exam  Updated Vital Signs BP (!) 145/95 (BP Location: Left Arm)   Pulse 97   Temp 98.8 F (37.1 C) (Oral)   Resp (!) 21   Ht 1.702 m (5\' 7" )   Wt 59 kg   SpO2 100%   BMI 20.37 kg/m   Physical Exam Vitals and nursing note reviewed.  Constitutional:      Appearance: Normal appearance. He is well-developed.  HENT:     Head: Atraumatic.     Nose: Nose normal.     Mouth/Throat:     Mouth: Mucous membranes are moist.     Pharynx: Oropharynx is clear.  Eyes:     General: No scleral icterus.     Conjunctiva/sclera: Conjunctivae normal.     Pupils: Pupils are equal, round, and reactive to light.  Neck:     Vascular: No carotid bruit.     Trachea: No tracheal deviation.     Comments: Thyroid not grossly enlarged or tender. Cardiovascular:     Rate and Rhythm: Normal rate and regular rhythm.     Pulses: Normal pulses.     Heart sounds: Normal heart sounds. No murmur heard. No friction rub. No gallop.   Pulmonary:     Effort: Pulmonary effort is normal. No accessory muscle usage or respiratory distress.     Breath sounds: Normal breath sounds.  Abdominal:     General: Bowel sounds are normal. There is no distension.     Palpations: Abdomen is soft.     Tenderness: There is no abdominal tenderness.  Genitourinary:    Comments: No cva tenderness. Musculoskeletal:        General: No swelling.     Cervical back: Normal range of motion and neck supple. No rigidity.  Skin:    General: Skin is warm and dry.     Findings: No rash.  Neurological:     Mental Status: He is alert.     Comments: Alert, speech clear. Motor/sens grossly intact bil. Steady gait.    Psychiatric:        Mood and Affect: Mood normal.     ED Results / Procedures / Treatments   Labs (all labs ordered are listed, but only abnormal results are displayed) Results for orders placed or performed during the hospital encounter of 05/30/20  CBC  Result Value Ref Range   WBC 8.0 4.0 - 10.5 K/uL   RBC 4.49 4.22 - 5.81 MIL/uL   Hemoglobin 13.2 13.0 - 17.0 g/dL   HCT 06/01/20 23.5 - 57.3 %   MCV 87.3 80.0 - 100.0 fL   MCH 29.4 26.0 - 34.0 pg   MCHC 33.7 30.0 - 36.0 g/dL   RDW 22.0 25.4 - 27.0 %   Platelets 229 150 - 400 K/uL   nRBC 0.0 0.0 - 0.2 %  Comprehensive metabolic panel  Result Value Ref Range   Sodium 140 135 - 145 mmol/L   Potassium 3.2 (L) 3.5 - 5.1 mmol/L   Chloride 105 98 - 111 mmol/L   CO2 27 22 - 32 mmol/L   Glucose, Bld 157 (H) 70 - 99 mg/dL   BUN 8 6 - 20 mg/dL   Creatinine, Ser 62.3 0.61 -  1.24 mg/dL   Calcium 9.3 8.9 - 7.62 mg/dL   Total Protein 7.5 6.5 - 8.1 g/dL   Albumin 4.3 3.5 - 5.0 g/dL   AST 94 (H) 15 - 41 U/L   ALT 213 (H) 0 - 44 U/L   Alkaline Phosphatase 86 38 -  126 U/L   Total Bilirubin 0.3 0.3 - 1.2 mg/dL   GFR, Estimated >96 >04 mL/min   Anion gap 8 5 - 15  Ethanol  Result Value Ref Range   Alcohol, Ethyl (B) <10 <10 mg/dL    EKG None  Radiology No results found.  Procedures Procedures (including critical care time)  Medications Ordered in ED Medications - No data to display  ED Course  I have reviewed the triage vital signs and the nursing notes.  Pertinent labs & imaging results that were available during my care of the patient were reviewed by me and considered in my medical decision making (see chart for details).    MDM Rules/Calculators/A&P                         Labs sent.  Reviewed nursing notes and prior charts for additional history.  Prior visits reviewed.   Labs reviewed/interpreted by me - NA normal, k mildly low - kcl po. Mild elevation ast, alt. No abd pain or tenderness, no fever.   Rec vistaril for symptom relief, and rec beh health and pcp f/u as outpatient.  Resource guide provided.   Pt currently appears stable for d/c.    Final Clinical Impression(s) / ED Diagnoses Final diagnoses:  None    Rx / DC Orders ED Discharge Orders    None       Cathren Laine, MD 05/30/20 [phone]

## 2020-05-30 NOTE — Discharge Instructions (Addendum)
It was our pleasure to provide your ER care today - we hope that you feel better.  From today's lab tests, your potassium level is mildly low (3.2) - eat plenty of fruits and vegetables, and follow up with primary care doctor in 1-2 weeks. Also from the lab tests, a couple of your liver function tests are elevated (AST 94, ALT 213) - also follow up with your doctor.   Continue to avoid drug use, as drug use likely will exacerbate your symptoms, and drug use has been associated with the development of mental health illness.  Also follow up with mental health services in the next 1-2 weeks - see resource guide for community treatment/counseling options.   You may try taking vistaril as need for insomnia, anxiety, and symptom relief - no driving when taking.   Return to ER if worse, new symptoms, new or severe pain, fevers, trouble breathing, or other concern.

## 2020-05-30 NOTE — ED Notes (Signed)
Offer pt water he tolerated it well

## 2020-06-11 ENCOUNTER — Other Ambulatory Visit: Payer: Self-pay

## 2020-06-11 ENCOUNTER — Ambulatory Visit (HOSPITAL_COMMUNITY)
Admission: EM | Admit: 2020-06-11 | Discharge: 2020-06-11 | Disposition: A | Discharge status: Home / Self Care | Payer: No Payment, Other | Attending: Psychiatry | Admitting: Psychiatry

## 2020-06-11 DIAGNOSIS — F1994 Other psychoactive substance use, unspecified with psychoactive substance-induced mood disorder: Secondary | ICD-10-CM

## 2020-06-11 DIAGNOSIS — R440 Auditory hallucinations: Secondary | ICD-10-CM | POA: Insufficient documentation

## 2020-06-11 DIAGNOSIS — F1910 Other psychoactive substance abuse, uncomplicated: Secondary | ICD-10-CM | POA: Insufficient documentation

## 2020-06-11 DIAGNOSIS — F1914 Other psychoactive substance abuse with psychoactive substance-induced mood disorder: Secondary | ICD-10-CM | POA: Insufficient documentation

## 2020-06-11 DIAGNOSIS — Z7289 Other problems related to lifestyle: Secondary | ICD-10-CM | POA: Insufficient documentation

## 2020-06-11 NOTE — ED Provider Notes (Signed)
Chronic.  Behavioral Health Urgent Care Medical Screening Exam  Patient Name: Brad Todd MRN: [id number] Date of Evaluation: 06/11/20 Chief Complaint:   Diagnosis:  Final diagnoses:  Polysubstance abuse (HCC)  Substance induced mood disorder (HCC)    History of Present illness: Brad Todd is a 29 y.o. male.  Patient presents voluntarily to [facility] center for walk-in assessment.  Patient states "I have been hearing whispering for approximately 1 month."  Patient reports one episode of hearing whispers in the past when he stopped Suboxone for 2 to 3 weeks.  Patient reports he recently stopped using Suboxone 1 month ago, believes the whispers began 1 month ago.  Patient endorses auditory hallucinations, whispering, x1 month.  Patient denies command hallucinations.  Patient denies visual hallucinations.  Patient reports recent stressors include break-up with his girlfriend approximately 2 months ago.  Patient reports he also has difficulty with transportation, he is currently attempting to assemble a car.  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  Patient reports "I have always had insomnia, I have always had trouble sleeping."  Patient reports he has tried melatonin, valerian root and Benadryl without improvement in sleeping habits.  Patient reports he resides in [place] with his mother and father.  Patient denies access to weapons.  Patient is employed in [employer].  Patient reports one use of marijuana approximately 2 weeks ago and 1 "pain pill from a friend" approximately 2 weeks ago.  Patient endorses alcohol use approximately 2-3 drinks per week.  Patient assessed by nurse practitioner.  Patient alert and oriented, answers appropriately.  Patient denies suicidal ideations, denies any history of suicide attempts.  Patient denies self-harm behaviors.   Patient denies symptoms of paranoia.  There is no evidence of delusional thought content and no indication that patient is responding to internal stimuli.  Patient offered support and encouragement.  Patient gives verbal consent to speak with his mother, Jennette Kettle who is in the lobby. Spoke with patient's mother who denies concerns for patient safety aside from substance and alcohol use.  Patient's mother reports "we have been dealing with this for a long time."  Patient's mother reports patient has polysubstance use disorder.  Per patient's mother patient's drug of choice as heroin however patient also uses Xanax, Xanax bars, methamphetamine and cocaine. Per patient's mother patient drinks alcohol "every day, whenever he has money."  Patient's mother reports patient has history of overdose on heroin 38 times.  Patient's mother reports she and her husband have revived the patient using Narcan 18 times in the past.  Patient's mother believes he began using methamphetamine approximately 1-1/2 to 2 months ago and behavior has been bizarre intermittently during that time.  Patient's mother reports patient has "knocked holes in all the walls of my home." Patient's mother agrees with plan to follow-up with substance use disorder resources provided.   Psychiatric Specialty Exam  Presentation  General Appearance:Appropriate for Environment; Casual  Eye Contact:Good  Speech:Clear and Coherent; Normal Rate  Speech Volume:Normal  Handedness:Right   Mood and Affect  Mood:Anxious  Affect:Appropriate; Congruent   Thought Process  Thought Processes:Coherent; Goal Directed  Descriptions of Associations:Intact  Orientation:Full (Time, Place and Person)  Thought Content:Logical  Hallucinations:Auditory "I hear whispers"  Ideas of Reference:None  Suicidal Thoughts:No  Homicidal Thoughts:No   Sensorium  Memory:Immediate Good; Recent Good; Remote  Good  Judgment:Fair  Insight:Fair  Executive Functions  Concentration:Fair  Attention Span:Good  Recall:Good  [employer] of Knowledge:Good  Language:Good   Psychomotor Activity  Psychomotor Activity:Normal   Assets  Assets:Communication Skills; Desire for Improvement; Financial Resources/Insurance; Housing; Intimacy; Leisure Time; Physical Health; Resilience; Social Support   Sleep  Sleep:Poor  Number of hours: No data recorded  Physical Exam: Physical Exam Vitals and nursing note reviewed.  Constitutional:      Appearance: He is well-developed.  HENT:     Head: Normocephalic.  Cardiovascular:     Rate and Rhythm: Normal rate.  Pulmonary:     Effort: Pulmonary effort is normal.  Neurological:     Mental Status: He is alert and oriented to person, place, and time.  Psychiatric:        Attention and Perception: Attention normal. He perceives auditory hallucinations.        Mood and Affect: Affect normal. Mood is anxious.        Speech: Speech normal.        Behavior: Behavior normal. Behavior is cooperative.        Thought Content: Thought content normal.        Cognition and Memory: Cognition and memory normal.        Judgment: Judgment normal.    Review of Systems  Constitutional: Negative.   HENT: Negative.   Eyes: Negative.   Respiratory: Negative.   Cardiovascular: Negative.   Gastrointestinal: Negative.   Genitourinary: Negative.   Musculoskeletal: Negative.   Skin: Negative.   Neurological: Negative.   Endo/Heme/Allergies: Negative.   Psychiatric/Behavioral: Positive for substance abuse. The patient is nervous/anxious.    Blood pressure (!) 128/94, pulse (!) 116, resp. rate 18, SpO2 100 %. There is no height or weight on file to calculate BMI.  Musculoskeletal: Strength & Muscle Tone: within normal limits Gait & Station: normal Patient leans: N/A   [facility] MSE Discharge Disposition for Follow up and Recommendations: Based on my evaluation the  patient does not appear to have an emergency medical condition and can be discharged with resources and follow up care in outpatient services for Medication Management and Individual Therapy  Patient reviewed with Dr. Nelly Rout. Patient to follow-up with established outpatient psychiatry provider at [facility]. Patient encouraged to follow-up with substance use treatment resources provided.    Patrcia Dolly, FNP 06/11/2020, 6:35 PM

## 2020-06-11 NOTE — ED Notes (Signed)
LOCKER 25  

## 2020-06-11 NOTE — BH Assessment (Addendum)
Comprehensive Clinical Assessment (CCA) Screening, Triage and Referral Note  06/11/2020 Brad Todd [id number]   Patient is a 29 year old male with a history polysubstance dependence (heroin, benzos, THC, meth and ETOH) who presents voluntarily to [facility] for assessment.  Patient reports he has been hearing "whispers and tapping" for the past few weeks, and this has become bothersome and interferes with his sleep.  Patient states he is up much of the night, most nights and sleeps 4-6 hours others.  Another contributing factor to sleep issues has been he recently started working night shift and is adjusting to the schedule.  He denies command type hallucinations, and describes whispers as indecipherable chatter.  Patient reports SA history and states he has been clean and sober for 8-9 years outside of occasional THC use.  Patient recently discontinued suboxone, stating he was ready to "move on" from it.  He recalls experiencing "whispers" the last time he discontinued suboxone.  Patient denies SI and HI.  He reports feeling overwhelmed by lack of sleep, stating this is beginning to interfere in his work.  He states he has lost 15 lbs this month, due to poor appetite.  He denied appetite concerns during his most recent ED visit on 05/30/20.   He worries that he may appear tired or strange to colleageus, as he is dealing with the whispers.  Patient gives verbal consent for [facility] and provider to speak with his parents.  Patient's mother and father are concerned about ongoing substance use.  They are unaware of current drug use, however they feel patient is likely using substances.  Patient's father states he was called to "help your son" when a neighbor observed him digging in the yard, only wearing underwear two months ago.  Patient's mother shares that patient has overdosed on heroin 38 times and they have "revived him 18 times."  She expresses exhaustion, as she no longer knows how  to support patient and feels she has "tried everything."  She eludes to feeling she is enabling patient, as she continues to allow him to live in the home at this point.  She shares patient has random rage episodes and will destroy property and punch holes in their walls.  They were concerned that patient could be aggressive towards them or engage in self-harm after he has "looked up suicide" on his mother's phone.  Parents were informed that patient does not currently meet inpatient criteria, as he is not endorsing SI or HI and he is not acutely psychotic.  Residential substance use treatment is recommended prior to further psychiatric evaluation, as symptoms are likely related to ongoing substance use.  They are interested in resources for residential treatment.     Disposition: Per Berneice Heinrich, NP, patient does not meet criteria for inpatient treatment.  Residential SA treatment is recommended.  Patient has been provided with referral information for [facility] in [place].  He was also provided with a list of other SA treatment options.     Chief Complaint:  Chief Complaint  Patient presents with  . Hallucinations    Pt stated he is hearing whispers, voices and tapping sounds for about a month. He has been unable to sleep. No phx mental illness and no drug use in over a month.   Visit Diagnosis: Polysubstance Dependence   Patient Reported Information How did you hear about [place]? Other (Comment) (Phreesia 06/11/2020)   Referral name: [name] 06/11/2020)   Referral phone number: No  data recorded Whom do you see for routine medical problems? I don't have a doctor (Phreesia 06/11/2020)   Practice/Facility Name: No data recorded  Practice/Facility Phone Number: No data recorded  Name of Contact: No data recorded  Contact Number: No data recorded  Contact Fax Number: No data recorded  Prescriber Name: No data recorded  Prescriber Address (if known): No data recorded What Is  the Reason for Your Visit/Call Today? Hearing Voices (Phreesia 06/11/2020)  How Long Has This Been Causing You Problems? 1 wk - 1 month (Phreesia 06/11/2020)  Have You Recently Been in Any Inpatient Treatment (Hospital/Detox/Crisis Center/28-Day Program)? No (Phreesia 06/11/2020)   Name/Location of Program/Hospital:No data recorded  How Long Were You There? No data recorded  When Were You Discharged? No data recorded Have You Ever Received Services From [facility] Before? No (Phreesia 06/11/2020)   Who Do You See at [facility]? No data recorded Have You Recently Had Any Thoughts About Hurting Yourself? No (Phreesia 06/11/2020)   Are You Planning to Commit Suicide/Harm Yourself At This time?  No (Phreesia 06/11/2020)  Have you Recently Had Thoughts About Hurting Someone Karolee Ohs? Yes (Phreesia 06/11/2020)   Explanation: No data recorded Have You Used Any Alcohol or Drugs in the Past 24 Hours? No (Phreesia 06/11/2020)   How Long Ago Did You Use Drugs or Alcohol?  No data recorded  What Did You Use and How Much? No data recorded What Do You Feel Would Help You the Most Today? Other (Comment) (Phreesia 06/11/2020)  Do You Currently Have a Therapist/Psychiatrist? No (Phreesia 06/11/2020)   Name of Therapist/Psychiatrist: No data recorded  Have You Been Recently Discharged From Any Office Practice or Programs? No (Phreesia 06/11/2020)   Explanation of Discharge From Practice/Program:  No data recorded    CCA Screening Triage Referral Assessment Type of Contact: Face-to-Face   Is this Initial or Reassessment? No data recorded  Date Telepsych consult ordered in [facility]:  No data recorded  Time Telepsych consult ordered in [facility]:  No data recorded Patient Reported Information Reviewed? Yes   Patient Left Without Being Seen? No data recorded  Reason for Not Completing Assessment: No data recorded Collateral Involvement: Patient's parents have provided collateral.  Does Patient Have a  Court Appointed Legal Guardian? No data recorded  Name and Contact of Legal Guardian:  No data recorded If Minor and Not Living with Parent(s), Who has Custody? No data recorded Is CPS involved or ever been involved? Never  Is APS involved or ever been involved? Never  Patient Determined To Be At Risk for Harm To Self or Others Based on Review of Patient Reported Information or Presenting Complaint? No   Method: No data recorded  Availability of Means: No data recorded  Intent: No data recorded  Notification Required: No data recorded  Additional Information for Danger to Others Potential:  No data recorded  Additional Comments for Danger to Others Potential:  No data recorded  Are There Guns or Other Weapons in Your Home?  No data recorded   Types of Guns/Weapons: No data recorded   Are These Weapons Safely Secured?                              No data recorded   Who Could Verify You Are Able To Have These Secured:    No data recorded Do You Have any Outstanding Charges, Pending Court Dates, Parole/Probation? No data recorded Contacted To Inform of  Risk of Harm To Self or Others: No data recorded Location of Assessment: [facility] Assessment Services  Does Patient Present under Involuntary Commitment? No   IVC Papers Initial File Date: No data recorded  [place] of Residence: [place]  Patient Currently Receiving the Following Services: Not Receiving Services   Determination of Need: Routine (7 days)   Options For Referral: Facility-Based Crisis   Yetta Glassman, [facility]

## 2020-06-11 NOTE — ED Notes (Signed)
Discharge instructions provided and Pt stated understanding. Personal belongings returned. Pt escorted to the front lobby. Pt alert, orient and ambulatory. Safety maintained.

## 2020-06-11 NOTE — Discharge Instructions (Addendum)
Patient is instructed prior to discharge to:  Take all medications as prescribed by his/her mental healthcare provider. Report any adverse effects and or reactions from the medicines to his/her outpatient provider promptly. Keep all scheduled appointments, to ensure that you are getting refills on time and to avoid any interruption in your medication.  If you are unable to keep an appointment call to reschedule.  Be sure to follow-up with resources and follow-up appointments provided.  Patient has been instructed & cautioned: To not engage in alcohol and or illegal drug use while on prescription medicines. In the event of worsening symptoms, patient is instructed to call the crisis hotline, 911 and or go to the nearest ED for appropriate evaluation and treatment of symptoms. To follow-up with his/her primary care provider for your other medical issues, concerns and or health care needs.   Substance abuse resources and Residential Options:  [facility] day residential substance abuse facility (not an option if you have active assault charges). [address], [place], [place] [address] Phone: [phone]: Ask for Shayla in admissions to complete intake if interested in pursuing this option.  [facility]: Can get intake scheduled; (not an option if you have active assault charges). [address] [place], [place] ([phone]) Call Mon-Fri.  [facility]): (offers outpatient therapy and intensive outpatient substance abuse therapy).  [address], [place], [place] [address] Phone: [phone]  [facility]: Offers FREE recovery skills classes, support groups, 1:1 Peer Support, and Compeer Classes. [address], , [place] [address] Phone: [phone] (Call to complete intake).   [facility] [address] [place], [place] [address] Phone: [phone] ext [phone]  [facility] provides food,  shelter and other programs and services to the homeless men of [place] through our men's program.  By offering safe shelter, three meals a day, clean clothing, Biblical counseling, financial planning, vocational training, GED/education and employment assistance, we've helped mend the shattered lives of many homeless men since opening in 1974.  We have approximately 267 beds available, with a max of 312 beds including mats for emergency situations and currently house an average of 270 men a night.  Prospective Client Check-In Information Photo ID Required (State/ Out of State/ DOC) - if photo ID is not available, clients are required to have a printout of a police/sheriff's criminal history report. Help out with chores around the Mission. No sex offender of any type (pending, charged, registered and/or any other sex related offenses) will be permitted to check in. Must be willing to abide by all rules, regulations, and policies established by the [facility]. The following will be provided - shelter, food, clothing, and biblical counseling. If you or someone you know is in need of assistance at our men's shelter in , [place], please call [phone] ext. [phone].  

## 2020-06-12 ENCOUNTER — Emergency Department (HOSPITAL_COMMUNITY)
Admission: EM | Admit: 2020-06-12 | Discharge: 2020-06-12 | Disposition: A | Discharge status: Home / Self Care | Payer: Self-pay | Attending: Emergency Medicine | Admitting: Emergency Medicine

## 2020-06-12 ENCOUNTER — Encounter (HOSPITAL_COMMUNITY): Payer: Self-pay | Admitting: Emergency Medicine

## 2020-06-12 ENCOUNTER — Other Ambulatory Visit: Payer: Self-pay

## 2020-06-12 DIAGNOSIS — T50901A Poisoning by unspecified drugs, medicaments and biological substances, accidental (unintentional), initial encounter: Secondary | ICD-10-CM

## 2020-06-12 DIAGNOSIS — F17210 Nicotine dependence, cigarettes, uncomplicated: Secondary | ICD-10-CM | POA: Insufficient documentation

## 2020-06-12 DIAGNOSIS — T402X1A Poisoning by other opioids, accidental (unintentional), initial encounter: Secondary | ICD-10-CM | POA: Insufficient documentation

## 2020-06-12 MED ORDER — SODIUM CHLORIDE 0.9 % IV BOLUS
1000.0000 mL | Freq: Once | INTRAVENOUS | Status: AC
  Administered 2020-06-12: 1000 mL via INTRAVENOUS

## 2020-06-12 NOTE — Discharge Instructions (Signed)
Follow-up with the resources provided.

## 2020-06-12 NOTE — ED Provider Notes (Signed)
[facility] EMERGENCY DEPARTMENT Provider Note   CSN: [id number] Arrival date & time: 06/12/20  1720     History Chief Complaint  Patient presents with  . Drug Overdose    Brad Todd is a 30 y.o. male past medical history of polysubstance abuse brought in by EMS for evaluation of overdose.  Patient reports taking 30 mg of oxycodone.  Patient states he did this because he just wanted to sleep.  He states that since doing meth a few weeks ago, he has had difficulty sleeping and that he just wanted to sleep for a while so he took the oxycodone.  He states he was not trying to kill himself.  He denies any current SI, HI.  He states he has not had any other drug use.  He denies any alcohol use.  He denies any complaints at this time.  States he is not having any chest pain, difficulty breathing, abdominal pain, nausea/vomiting.  The history is provided by the patient.       Past Medical History:  Diagnosis Date  . Chronic shoulder pain   . Polysubstance abuse (HCC)    cocaine, opiates, benzos  . Rhabdomyolysis 2011   accutane induced    Patient Active Problem List   Diagnosis Date Noted  . MDD (major depressive disorder), recurrent episode, severe ([place]) 01/13/2018  . Polysubstance abuse ([place]) 01/13/2018  . Substance induced mood disorder ([place]) 01/13/2018  . Opiate overdose ([place]) 01/12/2018  . Acute encephalopathy 01/12/2018    Past Surgical History:  Procedure Laterality Date  . ORIF PROXIMAL HUMERUS FRACTURE  05/2014       No family history on file.  Social History   Tobacco Use  . Smoking status: Current Every Day Smoker    Packs/day: 1.50    Types: Cigarettes  . Smokeless tobacco: Never Used  Vaping Use  . Vaping Use: Never used  Substance Use Topics  . Alcohol use: Yes    Alcohol/week: 2.0 standard drinks    Types: 2 Cans of beer per week    Comment: daily  . Drug use: Yes    Types: Marijuana, Methamphetamines, Cocaine    Comment: heroin    Home  Medications Prior to Admission medications   Medication Sig Start Date End Date Taking? Authorizing Provider  hydrOXYzine (ATARAX/VISTARIL) 25 MG tablet Take 1 tablet (25 mg total) by mouth every 8 (eight) hours as needed for anxiety (or insomnia). 05/30/20   Lajean Saver, MD    Allergies    Patient has no known allergies.  Review of Systems   Review of Systems  Respiratory: Negative for shortness of breath.   Cardiovascular: Negative for chest pain.  Gastrointestinal: Negative for abdominal pain, nausea and vomiting.  All other systems reviewed and are negative.   Physical Exam Updated Vital Signs BP 96/60   Pulse 77   Temp 98.5 F (36.9 C) (Oral)   Resp 13   SpO2 100%   Physical Exam Vitals and nursing note reviewed.  Constitutional:      Appearance: Normal appearance. He is well-developed and well-nourished.  HENT:     Head: Normocephalic and atraumatic.     Mouth/Throat:     Mouth: Oropharynx is clear and moist and mucous membranes are normal.  Eyes:     General: Lids are normal.     Extraocular Movements: EOM normal.     Conjunctiva/sclera: Conjunctivae normal.     Pupils: Pupils are equal, round, and reactive to light.  Comments: Pupils 3 mm bilaterally.   Cardiovascular:     Rate and Rhythm: Normal rate and regular rhythm.     Pulses: Normal pulses.     Heart sounds: Normal heart sounds. No murmur heard. No friction rub. No gallop.   Pulmonary:     Effort: Pulmonary effort is normal.     Breath sounds: Normal breath sounds.  Abdominal:     Palpations: Abdomen is soft. Abdomen is not rigid.     Tenderness: There is no abdominal tenderness. There is no guarding.  Musculoskeletal:        General: Normal range of motion.     Cervical back: Full passive range of motion without pain.  Skin:    General: Skin is warm and dry.     Capillary Refill: Capillary refill takes less than 2 seconds.  Neurological:     Mental Status: He is alert and oriented to  person, place, and time.     Comments: Alert and oriented x 3  Psychiatric:        Mood and Affect: Mood and affect normal.        Speech: Speech normal.     Comments: No SI/HI     ED Results / Procedures / Treatments   Labs (all labs ordered are listed, but only abnormal results are displayed) Labs Reviewed - No data to display  EKG None  Radiology No results found.  Procedures Procedures (including critical care time)  Medications Ordered in ED Medications  sodium chloride 0.9 % bolus 1,000 mL (0 mLs Intravenous Stopped 06/12/20 2212)  sodium chloride 0.9 % bolus 1,000 mL (1,000 mLs Intravenous New Bag/Given (Non-Interop) 06/12/20 2211)    ED Course  I have reviewed the triage vital signs and the nursing notes.  Pertinent labs & imaging results that were available during my care of the patient were reviewed by me and considered in my medical decision making (see chart for details).    MDM Rules/Calculators/A&P                           30 year old male past medical history polysubstance abuse brought in by EMS for evaluation of drug overdose.  Reports that he took 30 mg oxycodone because he wanted to sleep.  No SI, HI.  On EMS arrival, he was unresponsive.  Received 12 mg of Narcan with improvement in mental status.  He denies any other drug use, alcohol use.  On initial arrival, he is afebrile, nontoxic-appearing.  Vital signs are stable.  On exam, he is alert and oriented x3 and able to answer questions.  Pupils are 3 mm bilaterally.  We will plan to monitor here in the ED.  Patient blood pressure was slightly low.  This was while he was sleeping.  Patient given liter of fluids while we monitor him here in the ED.  Reevaluation.  Blood pressure responsive to a liter of fluids.  He is resting comfortably.  Patient is ambulatory in the ED without any assistance.  He is alert and oriented x3 and able answer questions.  He has been able to eat and drink without any difficulty.   He is requesting to leave.  Patient denies any SI, HI.  He has mom is here to pick him up.  This time, patient appears clinically sober, able to make full medical decision-making capacity. At this time, patient exhibits no emergent life-threatening condition that require further evaluation in ED. Patient had  ample opportunity for questions and discussion. All patient's questions were answered with full understanding.  Patient provided with resources.  Portions of this note were generated with Scientist, clinical (histocompatibility and immunogenetics). Dictation errors may occur despite best attempts at proofreading.   Final Clinical Impression(s) / ED Diagnoses Final diagnoses:  Accidental drug overdose, initial encounter    Rx / DC Orders ED Discharge Orders    None       Rosana Hoes 06/12/20 2334    Sabas Sous, MD 06/12/20 2351

## 2020-06-12 NOTE — ED Notes (Signed)
Pt alert and oriented. Pt called for ride and wants to leave. Ate a snack. rec'd d/c papers and ambulated out to lobby. Unable to get new vital signs

## 2020-06-12 NOTE — ED Triage Notes (Signed)
PT from home via [facility]. Pt reports taking 1 30mg  Roxicodone. Upon arrival pt was unresponsive. Pt received a total of 12mg  narcan. Pt alert and oriented x 4 at this time.

## 2020-06-12 NOTE — ED Notes (Signed)
Pt tolerated PO fluids

## 2020-06-12 NOTE — ED Notes (Signed)
Pt given cup of water to drink by tech

## 2020-06-15 ENCOUNTER — Encounter (HOSPITAL_COMMUNITY): Payer: Self-pay

## 2020-06-15 ENCOUNTER — Emergency Department (HOSPITAL_COMMUNITY)
Admission: EM | Admit: 2020-06-15 | Discharge: 2020-06-16 | Disposition: A | Discharge status: Home / Self Care | Payer: Self-pay | Attending: Emergency Medicine | Admitting: Emergency Medicine

## 2020-06-15 ENCOUNTER — Telehealth (HOSPITAL_COMMUNITY): Payer: Self-pay | Admitting: General Practice

## 2020-06-15 ENCOUNTER — Emergency Department (HOSPITAL_COMMUNITY)
Admission: EM | Admit: 2020-06-15 | Discharge: 2020-06-15 | Disposition: A | Discharge status: Home / Self Care | Payer: Self-pay | Attending: Emergency Medicine | Admitting: Emergency Medicine

## 2020-06-15 ENCOUNTER — Other Ambulatory Visit: Payer: Self-pay

## 2020-06-15 DIAGNOSIS — T40601A Poisoning by unspecified narcotics, accidental (unintentional), initial encounter: Secondary | ICD-10-CM

## 2020-06-15 DIAGNOSIS — F332 Major depressive disorder, recurrent severe without psychotic features: Secondary | ICD-10-CM | POA: Insufficient documentation

## 2020-06-15 DIAGNOSIS — T50901A Poisoning by unspecified drugs, medicaments and biological substances, accidental (unintentional), initial encounter: Secondary | ICD-10-CM | POA: Insufficient documentation

## 2020-06-15 DIAGNOSIS — Z5321 Procedure and treatment not carried out due to patient leaving prior to being seen by health care provider: Secondary | ICD-10-CM | POA: Insufficient documentation

## 2020-06-15 DIAGNOSIS — F1910 Other psychoactive substance abuse, uncomplicated: Secondary | ICD-10-CM

## 2020-06-15 DIAGNOSIS — F1920 Other psychoactive substance dependence, uncomplicated: Secondary | ICD-10-CM | POA: Insufficient documentation

## 2020-06-15 DIAGNOSIS — Z20822 Contact with and (suspected) exposure to covid-19: Secondary | ICD-10-CM | POA: Insufficient documentation

## 2020-06-15 DIAGNOSIS — F17210 Nicotine dependence, cigarettes, uncomplicated: Secondary | ICD-10-CM | POA: Insufficient documentation

## 2020-06-15 DIAGNOSIS — T402X1A Poisoning by other opioids, accidental (unintentional), initial encounter: Secondary | ICD-10-CM | POA: Insufficient documentation

## 2020-06-15 LAB — RESP PANEL BY RT-PCR (FLU A&B, COVID) ARPGX2
Influenza A by PCR: NEGATIVE
Influenza B by PCR: NEGATIVE
SARS Coronavirus 2 by RT PCR: NEGATIVE

## 2020-06-15 MED ORDER — METHOCARBAMOL 500 MG PO TABS: 500.0000 mg | ORAL_TABLET | Freq: Three times a day (TID) | ORAL | Status: DC | PRN

## 2020-06-15 MED ORDER — LOPERAMIDE HCL 2 MG PO CAPS: 2.0000 mg | ORAL_CAPSULE | ORAL | Status: DC | PRN

## 2020-06-15 MED ORDER — NAPROXEN 250 MG PO TABS: 500.0000 mg | ORAL_TABLET | Freq: Two times a day (BID) | ORAL | Status: DC | PRN

## 2020-06-15 MED ORDER — NICOTINE 21 MG/24HR TD PT24: 21.0000 mg | MEDICATED_PATCH | Freq: Every day | TRANSDERMAL | Status: DC

## 2020-06-15 MED ORDER — DICYCLOMINE HCL 10 MG PO CAPS: 20.0000 mg | ORAL_CAPSULE | Freq: Four times a day (QID) | ORAL | Status: DC | PRN

## 2020-06-15 MED ORDER — CLONIDINE HCL 0.1 MG PO TABS
0.1000 mg | ORAL_TABLET | Freq: Four times a day (QID) | ORAL | Status: DC
  Administered 2020-06-15: 0.1 mg via ORAL
  Filled 2020-06-15: qty 1

## 2020-06-15 MED ORDER — HYDROXYZINE HCL 25 MG PO TABS: 25.0000 mg | ORAL_TABLET | Freq: Four times a day (QID) | ORAL | Status: DC | PRN

## 2020-06-15 MED ORDER — CLONIDINE HCL 0.1 MG PO TABS: 0.1000 mg | ORAL_TABLET | Freq: Two times a day (BID) | ORAL | Status: DC

## 2020-06-15 MED ORDER — CLONIDINE HCL 0.1 MG PO TABS: 0.1000 mg | ORAL_TABLET | Freq: Every day | ORAL | Status: DC

## 2020-06-15 MED ORDER — ONDANSETRON 4 MG PO TBDP: 4.0000 mg | ORAL_TABLET | Freq: Four times a day (QID) | ORAL | Status: DC | PRN

## 2020-06-15 MED ORDER — ACETAMINOPHEN 325 MG PO TABS: 650.0000 mg | ORAL_TABLET | ORAL | Status: DC | PRN

## 2020-06-15 NOTE — ED Triage Notes (Addendum)
Arrived via ems for overdose. Supposed to have been heroin but he thinks it was fentanyl. . rec'd 12mg  narcan. Has been on suboxone but ran out.

## 2020-06-15 NOTE — ED Notes (Signed)
wanded by security 

## 2020-06-15 NOTE — ED Notes (Signed)
Alert and oriented. Did not want to stay.

## 2020-06-15 NOTE — ED Triage Notes (Addendum)
Pt left recently for overdose. Pt alert and oriented. No SI/HI. IVC by mother for substance abuse.  Brought in by [facility]

## 2020-06-15 NOTE — ED Notes (Signed)
Patient drinking water due to inability to void at this time.

## 2020-06-15 NOTE — ED Provider Notes (Signed)
[facility] EMERGENCY DEPARTMENT Provider Note   CSN: [id number] Arrival date & time: 06/15/20  2151     History No chief complaint on file.   Brad Todd is a 30 y.o. male.  He has a history of substance abuse.  Has been on Suboxone but stopped it when he ran out and is trying to go cold [place].  Used heroin tonight and overdosed, received 12 mg of Narcan.  Ultimately signed out of the ED without being seen.  He is back after his parents took out an IVC on him.  He denies any medical complaints.  He denies intentional overdose to hurt himself.  The history is provided by the patient.  Drug Problem This is a chronic problem. The current episode started 3 to 5 hours ago. The problem has not changed since onset.Pertinent negatives include no chest pain, no abdominal pain, no headaches and no shortness of breath. The symptoms are aggravated by stress. Nothing relieves the symptoms. He has tried nothing for the symptoms. The treatment provided no relief.       Past Medical History:  Diagnosis Date   Chronic shoulder pain    Polysubstance abuse (HCC)    cocaine, opiates, benzos   Rhabdomyolysis 2011   accutane induced    Patient Active Problem List   Diagnosis Date Noted   MDD (major depressive disorder), recurrent episode, severe (HCC) 01/13/2018   Polysubstance abuse (HCC) 01/13/2018   Substance induced mood disorder (HCC) 01/13/2018   Opiate overdose (HCC) 01/12/2018   Acute encephalopathy 01/12/2018    Past Surgical History:  Procedure Laterality Date   ORIF PROXIMAL HUMERUS FRACTURE  05/2014       History reviewed. No pertinent family history.  Social History   Tobacco Use   Smoking status: Current Every Day Smoker    Packs/day: 1.50    Types: Cigarettes   Smokeless tobacco: Never Used  Vaping Use   Vaping Use: Never used  Substance Use Topics   Alcohol use: Yes    Alcohol/week: 2.0 standard drinks    Types: 2 Cans of beer per week     Comment: daily   Drug use: Yes    Types: Marijuana, Methamphetamines, Cocaine    Comment: heroin    Home Medications Prior to Admission medications   Medication Sig Start Date End Date Taking? Authorizing Provider  hydrOXYzine (ATARAX/VISTARIL) 25 MG tablet Take 1 tablet (25 mg total) by mouth every 8 (eight) hours as needed for anxiety (or insomnia). 05/30/20   Cathren Laine, MD    Allergies    Patient has no known allergies.  Review of Systems   Review of Systems  Constitutional: Negative for fever.  HENT: Negative for sore throat.   Eyes: Negative for visual disturbance.  Respiratory: Negative for shortness of breath.   Cardiovascular: Negative for chest pain.  Gastrointestinal: Negative for abdominal pain.  Genitourinary: Negative for dysuria.  Musculoskeletal: Negative for gait problem.  Skin: Negative for rash.  Neurological: Negative for headaches.    Physical Exam Updated Vital Signs BP 113/76 (BP Location: Right Arm)    Pulse (!) 107    Temp 98.6 F (37 C) (Oral)    Resp 18    Ht 5\' 7"  (1.702 m)    Wt 59 kg    SpO2 95%    BMI 20.37 kg/m   Physical Exam Vitals and nursing note reviewed.  Constitutional:      Appearance: Normal appearance. He is well-developed and well-nourished.  HENT:  Head: Normocephalic and atraumatic.  Eyes:     Conjunctiva/sclera: Conjunctivae normal.  Cardiovascular:     Rate and Rhythm: Regular rhythm. Tachycardia present.     Heart sounds: No murmur heard.   Pulmonary:     Effort: Pulmonary effort is normal. No respiratory distress.     Breath sounds: Normal breath sounds.  Abdominal:     Palpations: Abdomen is soft.     Tenderness: There is no abdominal tenderness.  Musculoskeletal:        General: No deformity, signs of injury or edema. Normal range of motion.     Cervical back: Neck supple.  Skin:    General: Skin is warm and dry.  Neurological:     General: No focal deficit present.     Mental Status: He is alert.      Gait: Gait normal.  Psychiatric:        Mood and Affect: Mood and affect normal.     ED Results / Procedures / Treatments   Labs (all labs ordered are listed, but only abnormal results are displayed) Labs Reviewed  COMPREHENSIVE METABOLIC PANEL - Abnormal; Notable for the following components:      Result Value   Glucose, Bld 117 (*)    Total Protein 8.2 (*)    AST 50 (*)    ALT 170 (*)    All other components within normal limits  CBC WITH DIFFERENTIAL/PLATELET - Abnormal; Notable for the following components:   WBC 14.2 (*)    Neutro Abs 9.3 (*)    All other components within normal limits  RESP PANEL BY RT-PCR (FLU A&B, COVID) ARPGX2  ETHANOL  RAPID URINE DRUG SCREEN, HOSP PERFORMED    EKG EKG Interpretation  Date/Time:  Tuesday June 15 2020 23:10:59 EST Ventricular Rate:  75 PR Interval:  156 QRS Duration: 84 QT Interval:  342 QTC Calculation: 381 R Axis:   63 Text Interpretation: Normal sinus rhythm Normal ECG rate improved from earlier today Confirmed by Aletta Edouard [phone]) on 06/15/2020 11:21:08 PM   Radiology No results found.  Procedures Procedures (including critical care time)  Medications Ordered in ED Medications  cloNIDine (CATAPRES) tablet 0.1 mg (0.1 mg Oral Given 06/15/20 2304)    Followed by  cloNIDine (CATAPRES) tablet 0.1 mg (has no administration in time range)    Followed by  cloNIDine (CATAPRES) tablet 0.1 mg (has no administration in time range)  dicyclomine (BENTYL) capsule 20 mg (has no administration in time range)  hydrOXYzine (ATARAX/VISTARIL) tablet 25 mg (has no administration in time range)  loperamide (IMODIUM) capsule 2-4 mg (has no administration in time range)  methocarbamol (ROBAXIN) tablet 500 mg (has no administration in time range)  naproxen (NAPROSYN) tablet 500 mg (has no administration in time range)  ondansetron (ZOFRAN-ODT) disintegrating tablet 4 mg (has no administration in time range)  acetaminophen  (TYLENOL) tablet 650 mg (has no administration in time range)  nicotine (NICODERM CQ - dosed in mg/24 hours) patch 21 mg (has no administration in time range)    ED Course  I have reviewed the triage vital signs and the nursing notes.  Pertinent labs & imaging results that were available during my care of the patient were reviewed by me and considered in my medical decision making (see chart for details).    MDM Rules/Calculators/A&P                         This patient complains of substance  abuse and narcotic overdose; this involves an extensive number of treatment Options and is a complaint that carries with it a high risk of complications and Morbidity. The differential includes substance abuse, psychiatric disorder, metabolic derangement depression  I ordered, reviewed and interpreted labs, which included CBC with elevated white count likely reactive, normal hemoglobin, chemistries fairly normal mild elevations of AST ALT question history of hepatitis I ordered medication for possible opiate withdrawal Additional history obtained from police Previous records obtained and reviewed in epic I consulted TTS and discussed lab and imaging findings  Critical Interventions: None  After the interventions stated above, I reevaluated the patient and found patient to be cooperative.  He understands he needs to see psychiatric team   Final Clinical Impression(s) / ED Diagnoses Final diagnoses:  Substance abuse (HCC)  Opiate overdose, accidental or unintentional, initial encounter [facility])    Rx / DC Orders ED Discharge Orders    None       Terrilee Files, MD 06/16/20 [phone]

## 2020-06-15 NOTE — Telephone Encounter (Signed)
Care Management - Follow Up [facility] Discharges   Writer made contact with the patient.  Patient reports that he has not made an appointment with a psychiatrist or therapist.   Patient reports that he is going to follow up with [facility].

## 2020-06-15 NOTE — ED Notes (Signed)
Personal belongings placed in locker. Patient calm and cooperative at this time. Patient has been wanded.

## 2020-06-16 DIAGNOSIS — F1920 Other psychoactive substance dependence, uncomplicated: Secondary | ICD-10-CM | POA: Insufficient documentation

## 2020-06-16 LAB — COMPREHENSIVE METABOLIC PANEL
ALT: 170 U/L — ABNORMAL HIGH (ref 0–44)
AST: 50 U/L — ABNORMAL HIGH (ref 15–41)
Albumin: 4.7 g/dL (ref 3.5–5.0)
Alkaline Phosphatase: 107 U/L (ref 38–126)
Anion gap: 9 (ref 5–15)
BUN: 9 mg/dL (ref 6–20)
CO2: 26 mmol/L (ref 22–32)
Calcium: 9.4 mg/dL (ref 8.9–10.3)
Chloride: 104 mmol/L (ref 98–111)
Creatinine, Ser: 0.8 mg/dL (ref 0.61–1.24)
GFR, Estimated: >60 mL/min (ref >60)
Glucose, Bld: 117 mg/dL — ABNORMAL HIGH (ref 70–99)
Potassium: 4.6 mmol/L (ref 3.5–5.1)
Sodium: 139 mmol/L (ref 135–145)
Total Bilirubin: 0.4 mg/dL (ref 0.3–1.2)
Total Protein: 8.2 g/dL — ABNORMAL HIGH (ref 6.5–8.1)

## 2020-06-16 LAB — RAPID URINE DRUG SCREEN, HOSP PERFORMED
Amphetamines: NOT DETECTED
Barbiturates: NOT DETECTED
Benzodiazepines: NOT DETECTED
Cocaine: NOT DETECTED
Opiates: NOT DETECTED
Tetrahydrocannabinol: NOT DETECTED

## 2020-06-16 LAB — CBC WITH DIFFERENTIAL/PLATELET
Abs Immature Granulocytes: 0.05 10*3/uL (ref 0.00–0.07)
Basophils Absolute: 0 10*3/uL (ref 0.0–0.1)
Basophils Relative: 0 %
Eosinophils Absolute: 0.1 10*3/uL (ref 0.0–0.5)
Eosinophils Relative: 0 %
HCT: 45.7 % (ref 39.0–52.0)
Hemoglobin: 14.8 g/dL (ref 13.0–17.0)
Immature Granulocytes: 0 %
Lymphocytes Relative: 28 %
Lymphs Abs: 3.9 10*3/uL (ref 0.7–4.0)
MCH: 29.3 pg (ref 26.0–34.0)
MCHC: 32.4 g/dL (ref 30.0–36.0)
MCV: 90.5 fL (ref 80.0–100.0)
Monocytes Absolute: 0.8 10*3/uL (ref 0.1–1.0)
Monocytes Relative: 6 %
Neutro Abs: 9.3 10*3/uL — ABNORMAL HIGH (ref 1.7–7.7)
Neutrophils Relative %: 66 %
Platelets: 294 10*3/uL (ref 150–400)
RBC: 5.05 MIL/uL (ref 4.22–5.81)
RDW: 13.2 % (ref 11.5–15.5)
WBC: 14.2 10*3/uL — ABNORMAL HIGH (ref 4.0–10.5)
nRBC: 0 % (ref 0.0–0.2)

## 2020-06-16 LAB — ETHANOL: Alcohol, Ethyl (B): <10 mg/dL (ref <10)

## 2020-06-16 NOTE — [facility] Counselor (Signed)
Consulted with Aundra Millet, social work at [facility], to discuss discharge plan.  We recommend Pt follow-up immediately and as a walk-in to [facility].  Instructions in discharge paperwork.

## 2020-06-16 NOTE — Discharge Instructions (Addendum)
Recommend follow-up as WALK-IN to [facility]   [address] [place], [place] [address] Phone: [phone]  Advised social work

## 2020-06-16 NOTE — ED Provider Notes (Signed)
Patient's labs were pending at change of shift and I was asked to check them.  Patient is waiting for TTS evaluation.  Results for orders placed or performed during the hospital encounter of 06/15/20  Resp Panel by RT-PCR (Flu A&B, Covid) Nasopharyngeal Swab   Specimen: Nasopharyngeal Swab; Nasopharyngeal(NP) swabs in vial transport medium  Result Value Ref Range   SARS Coronavirus 2 by RT PCR NEGATIVE NEGATIVE   Influenza A by PCR NEGATIVE NEGATIVE   Influenza B by PCR NEGATIVE NEGATIVE  Comprehensive metabolic panel  Result Value Ref Range   Sodium 139 135 - 145 mmol/L   Potassium 4.6 3.5 - 5.1 mmol/L   Chloride 104 98 - 111 mmol/L   CO2 26 22 - 32 mmol/L   Glucose, Bld 117 (H) 70 - 99 mg/dL   BUN 9 6 - 20 mg/dL   Creatinine, Ser 1.01 0.61 - 1.24 mg/dL   Calcium 9.4 8.9 - 75.1 mg/dL   Total Protein 8.2 (H) 6.5 - 8.1 g/dL   Albumin 4.7 3.5 - 5.0 g/dL   AST 50 (H) 15 - 41 U/L   ALT 170 (H) 0 - 44 U/L   Alkaline Phosphatase 107 38 - 126 U/L   Total Bilirubin 0.4 0.3 - 1.2 mg/dL   GFR, Estimated >02 >58 mL/min   Anion gap 9 5 - 15  Ethanol  Result Value Ref Range   Alcohol, Ethyl (B) <10 <10 mg/dL  Urine rapid drug screen (hosp performed)  Result Value Ref Range   Opiates NONE DETECTED NONE DETECTED   Cocaine NONE DETECTED NONE DETECTED   Benzodiazepines NONE DETECTED NONE DETECTED   Amphetamines NONE DETECTED NONE DETECTED   Tetrahydrocannabinol NONE DETECTED NONE DETECTED   Barbiturates NONE DETECTED NONE DETECTED  CBC with Diff  Result Value Ref Range   WBC 14.2 (H) 4.0 - 10.5 K/uL   RBC 5.05 4.22 - 5.81 MIL/uL   Hemoglobin 14.8 13.0 - 17.0 g/dL   HCT 52.7 78.2 - 42.3 %   MCV 90.5 80.0 - 100.0 fL   MCH 29.3 26.0 - 34.0 pg   MCHC 32.4 30.0 - 36.0 g/dL   RDW 53.6 14.4 - 31.5 %   Platelets 294 150 - 400 K/uL   nRBC 0.0 0.0 - 0.2 %   Neutrophils Relative % 66 %   Neutro Abs 9.3 (H) 1.7 - 7.7 K/uL   Lymphocytes Relative 28 %   Lymphs Abs 3.9 0.7 - 4.0 K/uL    Monocytes Relative 6 %   Monocytes Absolute 0.8 0.1 - 1.0 K/uL   Eosinophils Relative 0 %   Eosinophils Absolute 0.1 0.0 - 0.5 K/uL   Basophils Relative 0 %   Basophils Absolute 0.0 0.0 - 0.1 K/uL   Immature Granulocytes 0 %   Abs Immature Granulocytes 0.05 0.00 - 0.07 K/uL   Laboratory interpretation all normal except leukocytosis, minor elevation of LFTs     Devoria Albe, MD 06/16/20 [phone]

## 2020-06-16 NOTE — ED Notes (Signed)
Pt given peanut butter and crackers with sprite
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
## 2020-12-16 ENCOUNTER — Encounter (HOSPITAL_COMMUNITY): Payer: Self-pay | Admitting: *Deleted

## 2020-12-16 ENCOUNTER — Emergency Department (HOSPITAL_COMMUNITY)
Admission: EM | Admit: 2020-12-16 | Discharge: 2020-12-16 | Disposition: A | Discharge status: Home / Self Care | Payer: Self-pay | Attending: Emergency Medicine | Admitting: Emergency Medicine

## 2020-12-16 ENCOUNTER — Other Ambulatory Visit: Payer: Self-pay

## 2020-12-16 DIAGNOSIS — Z20822 Contact with and (suspected) exposure to covid-19: Secondary | ICD-10-CM | POA: Insufficient documentation

## 2020-12-16 DIAGNOSIS — F17210 Nicotine dependence, cigarettes, uncomplicated: Secondary | ICD-10-CM | POA: Insufficient documentation

## 2020-12-16 DIAGNOSIS — F419 Anxiety disorder, unspecified: Secondary | ICD-10-CM | POA: Insufficient documentation

## 2020-12-16 LAB — CBC WITH DIFFERENTIAL/PLATELET
Abs Immature Granulocytes: 0.03 10*3/uL (ref 0.00–0.07)
Basophils Absolute: 0 10*3/uL (ref 0.0–0.1)
Basophils Relative: 1 %
Eosinophils Absolute: 0.2 10*3/uL (ref 0.0–0.5)
Eosinophils Relative: 2 %
HCT: 49.3 % (ref 39.0–52.0)
Hemoglobin: 16.1 g/dL (ref 13.0–17.0)
Immature Granulocytes: 0 %
Lymphocytes Relative: 29 %
Lymphs Abs: 2.5 10*3/uL (ref 0.7–4.0)
MCH: 29.9 pg (ref 26.0–34.0)
MCHC: 32.7 g/dL (ref 30.0–36.0)
MCV: 91.5 fL (ref 80.0–100.0)
Monocytes Absolute: 0.6 10*3/uL (ref 0.1–1.0)
Monocytes Relative: 6 %
Neutro Abs: 5.5 10*3/uL (ref 1.7–7.7)
Neutrophils Relative %: 62 %
Platelets: 232 10*3/uL (ref 150–400)
RBC: 5.39 MIL/uL (ref 4.22–5.81)
RDW: 14.5 % (ref 11.5–15.5)
WBC: 8.8 10*3/uL (ref 4.0–10.5)
nRBC: 0 % (ref 0.0–0.2)

## 2020-12-16 LAB — RESP PANEL BY RT-PCR (FLU A&B, COVID) ARPGX2
Influenza A by PCR: NEGATIVE
Influenza B by PCR: NEGATIVE
SARS Coronavirus 2 by RT PCR: NEGATIVE

## 2020-12-16 LAB — BASIC METABOLIC PANEL
Anion gap: 7 (ref 5–15)
BUN: 14 mg/dL (ref 6–20)
CO2: 26 mmol/L (ref 22–32)
Calcium: 9.3 mg/dL (ref 8.9–10.3)
Chloride: 105 mmol/L (ref 98–111)
Creatinine, Ser: 0.83 mg/dL (ref 0.61–1.24)
GFR, Estimated: >60 mL/min (ref >60)
Glucose, Bld: 89 mg/dL (ref 70–99)
Potassium: 4.3 mmol/L (ref 3.5–5.1)
Sodium: 138 mmol/L (ref 135–145)

## 2020-12-16 LAB — ETHANOL: Alcohol, Ethyl (B): <10 mg/dL (ref <10)

## 2020-12-16 LAB — RAPID URINE DRUG SCREEN, HOSP PERFORMED
Amphetamines: NOT DETECTED
Barbiturates: NOT DETECTED
Benzodiazepines: NOT DETECTED
Cocaine: NOT DETECTED
Opiates: NOT DETECTED
Tetrahydrocannabinol: NOT DETECTED

## 2020-12-16 MED ORDER — HYDROXYZINE HCL 25 MG PO TABS
50.0000 mg | ORAL_TABLET | Freq: Once | ORAL | Status: AC
  Administered 2020-12-16: 50 mg via ORAL
  Filled 2020-12-16: qty 2

## 2020-12-16 MED ORDER — HYDROXYZINE HCL 25 MG PO TABS: 25.0000 mg | ORAL_TABLET | Freq: Four times a day (QID) | ORAL | 0 refills | Status: DC | PRN

## 2020-12-16 NOTE — ED Triage Notes (Signed)
Wanded in triage

## 2020-12-16 NOTE — ED Notes (Signed)
Pt waiting on tts. Brad Todd
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
## 2020-12-16 NOTE — ED Triage Notes (Signed)
States he is so stressed out he can hardly think

## 2020-12-16 NOTE — Discharge Instructions (Addendum)
Exam and vital signs are all reassuring.  I suspect you are suffering from anxiety, I have started you on Vistaril please take as needed for anxiety.  Also recommend mindful meditation exercising and decreasing your stress.  I given the information for the outpatient behavioral health care you may follow-up with them for further evaluation.    Come back to the emergency department if you develop chest pain, shortness of breath, severe abdominal pain, uncontrolled nausea, vomiting, diarrhea.

## 2020-12-28 ENCOUNTER — Emergency Department (HOSPITAL_COMMUNITY)
Admission: EM | Admit: 2020-12-28 | Discharge: 2020-12-28 | Disposition: A | Discharge status: Home / Self Care | Payer: Self-pay | Attending: Emergency Medicine | Admitting: Emergency Medicine

## 2020-12-28 ENCOUNTER — Ambulatory Visit (HOSPITAL_COMMUNITY)
Admission: EM | Admit: 2020-12-28 | Discharge: 2020-12-28 | Disposition: A | Discharge status: Home / Self Care | Payer: No Typology Code available for payment source | Attending: Emergency Medicine | Admitting: Emergency Medicine

## 2020-12-28 ENCOUNTER — Encounter (HOSPITAL_COMMUNITY): Payer: Self-pay | Admitting: Emergency Medicine

## 2020-12-28 ENCOUNTER — Encounter (HOSPITAL_COMMUNITY): Payer: Self-pay | Admitting: *Deleted

## 2020-12-28 ENCOUNTER — Other Ambulatory Visit: Payer: Self-pay

## 2020-12-28 DIAGNOSIS — F17210 Nicotine dependence, cigarettes, uncomplicated: Secondary | ICD-10-CM | POA: Insufficient documentation

## 2020-12-28 DIAGNOSIS — T7421XA Adult sexual abuse, confirmed, initial encounter: Secondary | ICD-10-CM | POA: Insufficient documentation

## 2020-12-28 DIAGNOSIS — Z0441 Encounter for examination and observation following alleged adult rape: Secondary | ICD-10-CM | POA: Insufficient documentation

## 2020-12-28 DIAGNOSIS — R000 Tachycardia, unspecified: Secondary | ICD-10-CM | POA: Insufficient documentation

## 2020-12-28 MED ORDER — AZITHROMYCIN 250 MG PO TABS
1000.0000 mg | ORAL_TABLET | Freq: Once | ORAL | Status: AC
  Administered 2020-12-28: 1000 mg via ORAL
  Filled 2020-12-28: qty 4

## 2020-12-28 MED ORDER — HYDROXYZINE HCL 25 MG PO TABS
25.0000 mg | ORAL_TABLET | Freq: Once | ORAL | Status: AC
  Administered 2020-12-28: 25 mg via ORAL
  Filled 2020-12-28: qty 1

## 2020-12-28 MED ORDER — LIDOCAINE HCL (PF) 1 % IJ SOLN
1.0000 mL | Freq: Once | INTRAMUSCULAR | Status: AC
  Administered 2020-12-28: 1 mL
  Filled 2020-12-28: qty 30

## 2020-12-28 MED ORDER — CEFTRIAXONE SODIUM 500 MG IJ SOLR
500.0000 mg | Freq: Once | INTRAMUSCULAR | Status: AC
  Administered 2020-12-28: 500 mg via INTRAMUSCULAR
  Filled 2020-12-28: qty 500
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
## 2020-12-28 NOTE — ED Notes (Signed)
Pt seen walking out of ED at this time.
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
## 2021-01-01 ENCOUNTER — Encounter (HOSPITAL_COMMUNITY): Payer: Self-pay | Admitting: Emergency Medicine

## 2021-01-01 ENCOUNTER — Observation Stay (HOSPITAL_COMMUNITY): Payer: Self-pay

## 2021-01-01 ENCOUNTER — Emergency Department (HOSPITAL_COMMUNITY): Payer: Self-pay

## 2021-01-01 ENCOUNTER — Other Ambulatory Visit: Payer: Self-pay

## 2021-01-01 ENCOUNTER — Observation Stay (HOSPITAL_COMMUNITY)
Admission: EM | Admit: 2021-01-01 | Discharge: 2021-01-02 | Disposition: A | Discharge status: Home / Self Care | Payer: Self-pay | Attending: Family Medicine | Admitting: Family Medicine

## 2021-01-01 DIAGNOSIS — G9341 Metabolic encephalopathy: Secondary | ICD-10-CM | POA: Insufficient documentation

## 2021-01-01 DIAGNOSIS — S27321A Contusion of lung, unilateral, initial encounter: Secondary | ICD-10-CM

## 2021-01-01 DIAGNOSIS — N179 Acute kidney failure, unspecified: Secondary | ICD-10-CM | POA: Insufficient documentation

## 2021-01-01 DIAGNOSIS — G9340 Encephalopathy, unspecified: Secondary | ICD-10-CM | POA: Diagnosis present

## 2021-01-01 DIAGNOSIS — Z20822 Contact with and (suspected) exposure to covid-19: Secondary | ICD-10-CM | POA: Insufficient documentation

## 2021-01-01 DIAGNOSIS — Y900 Blood alcohol level of less than 20 mg/100 ml: Secondary | ICD-10-CM | POA: Insufficient documentation

## 2021-01-01 DIAGNOSIS — N50819 Testicular pain, unspecified: Secondary | ICD-10-CM

## 2021-01-01 DIAGNOSIS — Z23 Encounter for immunization: Secondary | ICD-10-CM | POA: Insufficient documentation

## 2021-01-01 DIAGNOSIS — R319 Hematuria, unspecified: Secondary | ICD-10-CM | POA: Insufficient documentation

## 2021-01-01 DIAGNOSIS — F411 Generalized anxiety disorder: Secondary | ICD-10-CM | POA: Diagnosis present

## 2021-01-01 DIAGNOSIS — J9601 Acute respiratory failure with hypoxia: Secondary | ICD-10-CM | POA: Insufficient documentation

## 2021-01-01 DIAGNOSIS — J690 Pneumonitis due to inhalation of food and vomit: Secondary | ICD-10-CM | POA: Diagnosis present

## 2021-01-01 DIAGNOSIS — M79641 Pain in right hand: Secondary | ICD-10-CM

## 2021-01-01 DIAGNOSIS — F17210 Nicotine dependence, cigarettes, uncomplicated: Secondary | ICD-10-CM | POA: Insufficient documentation

## 2021-01-01 DIAGNOSIS — T401X1A Poisoning by heroin, accidental (unintentional), initial encounter: Principal | ICD-10-CM | POA: Insufficient documentation

## 2021-01-01 LAB — CBC WITH DIFFERENTIAL/PLATELET
Abs Immature Granulocytes: 0.49 10*3/uL — ABNORMAL HIGH (ref 0.00–0.07)
Basophils Absolute: 0.1 10*3/uL (ref 0.0–0.1)
Basophils Relative: 1 %
Eosinophils Absolute: 0.1 10*3/uL (ref 0.0–0.5)
Eosinophils Relative: 1 %
HCT: 52.9 % — ABNORMAL HIGH (ref 39.0–52.0)
Hemoglobin: 17.5 g/dL — ABNORMAL HIGH (ref 13.0–17.0)
Immature Granulocytes: 3 %
Lymphocytes Relative: 18 %
Lymphs Abs: 3.3 10*3/uL (ref 0.7–4.0)
MCH: 29.9 pg (ref 26.0–34.0)
MCHC: 33.1 g/dL (ref 30.0–36.0)
MCV: 90.3 fL (ref 80.0–100.0)
Monocytes Absolute: 0.8 10*3/uL (ref 0.1–1.0)
Monocytes Relative: 5 %
Neutro Abs: 13.1 10*3/uL — ABNORMAL HIGH (ref 1.7–7.7)
Neutrophils Relative %: 72 %
Platelets: 329 10*3/uL (ref 150–400)
RBC: 5.86 MIL/uL — ABNORMAL HIGH (ref 4.22–5.81)
RDW: 13.7 % (ref 11.5–15.5)
WBC: 17.9 10*3/uL — ABNORMAL HIGH (ref 4.0–10.5)
nRBC: 0 % (ref 0.0–0.2)

## 2021-01-01 LAB — COMPREHENSIVE METABOLIC PANEL
ALT: 93 U/L — ABNORMAL HIGH (ref 0–44)
AST: 79 U/L — ABNORMAL HIGH (ref 15–41)
Albumin: 5.1 g/dL — ABNORMAL HIGH (ref 3.5–5.0)
Alkaline Phosphatase: 144 U/L — ABNORMAL HIGH (ref 38–126)
Anion gap: 12 (ref 5–15)
BUN: 18 mg/dL (ref 6–20)
CO2: 23 mmol/L (ref 22–32)
Calcium: 8.8 mg/dL — ABNORMAL LOW (ref 8.9–10.3)
Chloride: 100 mmol/L (ref 98–111)
Creatinine, Ser: 1.33 mg/dL — ABNORMAL HIGH (ref 0.61–1.24)
GFR, Estimated: >60 mL/min (ref >60)
Glucose, Bld: 188 mg/dL — ABNORMAL HIGH (ref 70–99)
Potassium: 4.1 mmol/L (ref 3.5–5.1)
Sodium: 135 mmol/L (ref 135–145)
Total Bilirubin: 0.5 mg/dL (ref 0.3–1.2)
Total Protein: 9.3 g/dL — ABNORMAL HIGH (ref 6.5–8.1)

## 2021-01-01 LAB — ACETAMINOPHEN LEVEL: Acetaminophen (Tylenol), Serum: <10 ug/mL — ABNORMAL LOW (ref 10–30)

## 2021-01-01 LAB — RAPID URINE DRUG SCREEN, HOSP PERFORMED
Amphetamines: NOT DETECTED
Barbiturates: NOT DETECTED
Benzodiazepines: NOT DETECTED
Cocaine: POSITIVE — AB
Opiates: NOT DETECTED
Tetrahydrocannabinol: NOT DETECTED

## 2021-01-01 LAB — ETHANOL: Alcohol, Ethyl (B): 41 mg/dL — ABNORMAL HIGH (ref <10)

## 2021-01-01 LAB — RESP PANEL BY RT-PCR (FLU A&B, COVID) ARPGX2
Influenza A by PCR: NEGATIVE
Influenza B by PCR: NEGATIVE
SARS Coronavirus 2 by RT PCR: NEGATIVE

## 2021-01-01 LAB — SALICYLATE LEVEL: Salicylate Lvl: <7 mg/dL — ABNORMAL LOW (ref 7.0–30.0)

## 2021-01-01 LAB — URINALYSIS, ROUTINE W REFLEX MICROSCOPIC
Bilirubin Urine: NEGATIVE
Glucose, UA: 50 mg/dL — AB
Ketones, ur: NEGATIVE mg/dL
Leukocytes,Ua: NEGATIVE
Nitrite: NEGATIVE
Protein, ur: NEGATIVE mg/dL
RBC / HPF: >50 RBC/hpf — ABNORMAL HIGH (ref 0–5)
Specific Gravity, Urine: >1.046 — ABNORMAL HIGH (ref 1.005–1.030)
pH: 5 (ref 5.0–8.0)

## 2021-01-01 LAB — LACTIC ACID, PLASMA
Lactic Acid, Venous: 1.5 mmol/L (ref 0.5–1.9)
Lactic Acid, Venous: 1.6 mmol/L (ref 0.5–1.9)

## 2021-01-01 LAB — HIV ANTIBODY (ROUTINE TESTING W REFLEX): HIV Screen 4th Generation wRfx: NONREACTIVE

## 2021-01-01 MED ORDER — IOHEXOL 300 MG/ML  SOLN
100.0000 mL | Freq: Once | INTRAMUSCULAR | Status: AC | PRN
  Administered 2021-01-01: 100 mL via INTRAVENOUS

## 2021-01-01 MED ORDER — LORAZEPAM 0.5 MG PO TABS
0.5000 mg | ORAL_TABLET | ORAL | Status: DC | PRN
  Administered 2021-01-02: 0.5 mg via ORAL
  Filled 2021-01-01: qty 1

## 2021-01-01 MED ORDER — KETOROLAC TROMETHAMINE 30 MG/ML IJ SOLN
30.0000 mg | Freq: Four times a day (QID) | INTRAMUSCULAR | Status: DC | PRN
  Administered 2021-01-01 (×2): 30 mg via INTRAVENOUS
  Filled 2021-01-01 (×2): qty 1

## 2021-01-01 MED ORDER — HYDROXYZINE HCL 25 MG PO TABS
25.0000 mg | ORAL_TABLET | Freq: Four times a day (QID) | ORAL | Status: DC | PRN
  Filled 2021-01-01: qty 1

## 2021-01-01 MED ORDER — SODIUM CHLORIDE 0.9 % IV BOLUS
1000.0000 mL | Freq: Once | INTRAVENOUS | Status: AC
  Administered 2021-01-01: 1000 mL via INTRAVENOUS

## 2021-01-01 MED ORDER — ACETAMINOPHEN 650 MG RE SUPP: 650.0000 mg | Freq: Four times a day (QID) | RECTAL | Status: DC

## 2021-01-01 MED ORDER — TRAZODONE HCL 50 MG PO TABS
100.0000 mg | ORAL_TABLET | Freq: Every evening | ORAL | Status: DC | PRN
  Administered 2021-01-02: 100 mg via ORAL
  Filled 2021-01-01: qty 2

## 2021-01-01 MED ORDER — ACETAMINOPHEN 325 MG PO TABS
650.0000 mg | ORAL_TABLET | Freq: Four times a day (QID) | ORAL | Status: DC
  Administered 2021-01-01 – 2021-01-02 (×4): 650 mg via ORAL
  Filled 2021-01-01 (×4): qty 2

## 2021-01-01 MED ORDER — TETANUS-DIPHTH-ACELL PERTUSSIS 5-2.5-18.5 LF-MCG/0.5 IM SUSY
0.5000 mL | PREFILLED_SYRINGE | Freq: Once | INTRAMUSCULAR | Status: AC
  Administered 2021-01-01: 0.5 mL via INTRAMUSCULAR
  Filled 2021-01-01: qty 0.5

## 2021-01-01 MED ORDER — ACETAMINOPHEN 325 MG PO TABS: 650.0000 mg | ORAL_TABLET | Freq: Four times a day (QID) | ORAL | Status: DC | PRN

## 2021-01-01 MED ORDER — ONDANSETRON HCL 4 MG/2ML IJ SOLN: 4.0000 mg | Freq: Four times a day (QID) | INTRAMUSCULAR | Status: DC | PRN

## 2021-01-01 MED ORDER — ONDANSETRON HCL 4 MG PO TABS: 4.0000 mg | ORAL_TABLET | Freq: Four times a day (QID) | ORAL | Status: DC | PRN

## 2021-01-01 MED ORDER — SODIUM CHLORIDE 0.9 % IV SOLN: INTRAVENOUS | Status: DC

## 2021-01-01 MED ORDER — HYDROXYZINE HCL 25 MG PO TABS
50.0000 mg | ORAL_TABLET | Freq: Three times a day (TID) | ORAL | Status: DC | PRN
  Administered 2021-01-02: 50 mg via ORAL
  Filled 2021-01-01: qty 2

## 2021-01-01 MED ORDER — ALBUTEROL SULFATE (2.5 MG/3ML) 0.083% IN NEBU: 2.5000 mg | INHALATION_SOLUTION | RESPIRATORY_TRACT | Status: DC | PRN

## 2021-01-01 MED ORDER — SODIUM CHLORIDE 0.9 % IV SOLN
3.0000 g | Freq: Three times a day (TID) | INTRAVENOUS | Status: DC
  Administered 2021-01-01 – 2021-01-02 (×4): 3 g via INTRAVENOUS
  Filled 2021-01-01 (×8): qty 8

## 2021-01-01 MED ORDER — ACETAMINOPHEN 650 MG RE SUPP: 650.0000 mg | Freq: Four times a day (QID) | RECTAL | Status: DC | PRN

## 2021-01-01 MED ORDER — METHOCARBAMOL 1000 MG/10ML IJ SOLN
500.0000 mg | Freq: Four times a day (QID) | INTRAVENOUS | Status: DC | PRN
  Filled 2021-01-01: qty 5

## 2021-01-01 NOTE — ED Notes (Signed)
Attempted to ambulate pt, pt states that he does not think that he can not  walk yet.
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
## 2021-01-01 NOTE — ED Notes (Signed)
Fluid challenge completed with out difficulty  

## 2021-01-01 NOTE — ED Notes (Signed)
Pt states vistaril is not working for his anxiety any more.  Would like something stronger.  MD notified.

## 2021-01-01 NOTE — Progress Notes (Signed)
ASSUMPTION OF CARE NOTE   01/01/2021 2:22 PM  Brad Todd was seen and examined.  The H&P by the admitting provider, orders, imaging was reviewed.  Please see new orders.  Will continue to follow.     Vitals:   01/01/21 1259 01/01/21 1312  BP:  134/88  Pulse:  (!) 101  Resp:  20  Temp: 98.7 F (37.1 C) 98.3 F (36.8 C)  SpO2:  100%    Results for orders placed or performed during the hospital encounter of 01/01/21  Blood culture (routine x 2)   Specimen: Blood  Result Value Ref Range   Specimen Description BLOOD LEFT HAND    Special Requests      BOTTLES DRAWN AEROBIC AND ANAEROBIC Blood Culture adequate volume   Culture      NO GROWTH < 12 HOURS Performed at [facility], [address], [place], [place] [address]    Report Status PENDING   Blood culture (routine x 2)   Specimen: Blood  Result Value Ref Range   Specimen Description BLOOD RIGHT HAND    Special Requests      BOTTLES DRAWN AEROBIC AND ANAEROBIC Blood Culture adequate volume   Culture      NO GROWTH < 12 HOURS Performed at [facility], [address], [place], [place] [address]    Report Status PENDING   Resp Panel by RT-PCR (Flu A&B, Covid) Nasopharyngeal Swab   Specimen: Nasopharyngeal Swab; Nasopharyngeal(NP) swabs in vial transport medium  Result Value Ref Range   SARS Coronavirus 2 by RT PCR NEGATIVE NEGATIVE   Influenza A by PCR NEGATIVE NEGATIVE   Influenza B by PCR NEGATIVE NEGATIVE  CBC with Differential/Platelet  Result Value Ref Range   WBC 17.9 (H) 4.0 - 10.5 K/uL   RBC 5.86 (H) 4.22 - 5.81 MIL/uL   Hemoglobin 17.5 (H) 13.0 - 17.0 g/dL   HCT 58.5 (H) 27.7 - 82.4 %   MCV 90.3 80.0 - 100.0 fL   MCH 29.9 26.0 - 34.0 pg   MCHC 33.1 30.0 - 36.0 g/dL   RDW 23.5 36.1 - 44.3 %   Platelets 329 150 - 400 K/uL   nRBC 0.0 0.0 - 0.2 %   Neutrophils Relative % 72 %   Neutro Abs 13.1 (H) 1.7 - 7.7 K/uL   Lymphocytes Relative 18 %   Lymphs Abs 3.3 0.7 - 4.0 K/uL   Monocytes Relative 5 %    Monocytes Absolute 0.8 0.1 - 1.0 K/uL   Eosinophils Relative 1 %   Eosinophils Absolute 0.1 0.0 - 0.5 K/uL   Basophils Relative 1 %   Basophils Absolute 0.1 0.0 - 0.1 K/uL   Immature Granulocytes 3 %   Abs Immature Granulocytes 0.49 (H) 0.00 - 0.07 K/uL  Comprehensive metabolic panel  Result Value Ref Range   Sodium 135 135 - 145 mmol/L   Potassium 4.1 3.5 - 5.1 mmol/L   Chloride 100 98 - 111 mmol/L   CO2 23 22 - 32 mmol/L   Glucose, Bld 188 (H) 70 - 99 mg/dL   BUN 18 6 - 20 mg/dL   Creatinine, Ser 1.54 (H) 0.61 - 1.24 mg/dL   Calcium 8.8 (L) 8.9 - 10.3 mg/dL   Total Protein 9.3 (H) 6.5 - 8.1 g/dL   Albumin 5.1 (H) 3.5 - 5.0 g/dL   AST 79 (H) 15 - 41 U/L   ALT 93 (H) 0 - 44 U/L   Alkaline Phosphatase 144 (H) 38 - 126 U/L  Total Bilirubin 0.5 0.3 - 1.2 mg/dL   GFR, Estimated >22 >97 mL/min   Anion gap 12 5 - 15  Ethanol  Result Value Ref Range   Alcohol, Ethyl (B) 41 (H) <10 mg/dL  Acetaminophen level  Result Value Ref Range   Acetaminophen (Tylenol), Serum <10 (L) 10 - 30 ug/mL  Salicylate level  Result Value Ref Range   Salicylate Lvl <7.0 (L) 7.0 - 30.0 mg/dL  Lactic acid, plasma  Result Value Ref Range   Lactic Acid, Venous 1.5 0.5 - 1.9 mmol/L  Lactic acid, plasma  Result Value Ref Range   Lactic Acid, Venous 1.6 0.5 - 1.9 mmol/L  Urine rapid drug screen (hosp performed)  Result Value Ref Range   Opiates NONE DETECTED NONE DETECTED   Cocaine POSITIVE (A) NONE DETECTED   Benzodiazepines NONE DETECTED NONE DETECTED   Amphetamines NONE DETECTED NONE DETECTED   Tetrahydrocannabinol NONE DETECTED NONE DETECTED   Barbiturates NONE DETECTED NONE DETECTED  Urinalysis, Routine w reflex microscopic Urine, Clean Catch  Result Value Ref Range   Color, Urine YELLOW YELLOW   APPearance CLEAR CLEAR   Specific Gravity, Urine >1.046 (H) 1.005 - 1.030   pH 5.0 5.0 - 8.0   Glucose, UA 50 (A) NEGATIVE mg/dL   Hgb urine dipstick LARGE (A) NEGATIVE   Bilirubin Urine NEGATIVE  NEGATIVE   Ketones, ur NEGATIVE NEGATIVE mg/dL   Protein, ur NEGATIVE NEGATIVE mg/dL   Nitrite NEGATIVE NEGATIVE   Leukocytes,Ua NEGATIVE NEGATIVE   RBC / HPF >50 (H) 0 - 5 RBC/hpf   WBC, UA 0-5 0 - 5 WBC/hpf   Bacteria, UA RARE (A) NONE SEEN   Mucus PRESENT    Maryln Manuel, MD [facility]   01/01/2021  1:06 AM How to contact the [facility] Attending or Consulting provider 7A - 7P or covering provider during after hours 7P -7A, for this patient?  Check the care team in [facility] and look for a) attending/consulting TR[facility] provider listed and b) the [facility] team listed Log into [email or web address] and use [facility] universal password to access. If you do not have the password, please contact the hospital operator. Locate the [facility] provider you are looking for under [facility] and page to a number that you can be directly reached. If you still have difficulty reaching the provider, please page the [facility] (Director on Call) for the Hospitalists listed on amion for assistance.
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
## 2021-01-02 ENCOUNTER — Encounter (HOSPITAL_COMMUNITY): Payer: Self-pay | Admitting: Emergency Medicine

## 2021-01-02 ENCOUNTER — Emergency Department (HOSPITAL_COMMUNITY)
Admission: EM | Admit: 2021-01-02 | Discharge: 2021-01-05 | Disposition: A | Discharge status: Home / Self Care | Payer: Self-pay | Attending: Emergency Medicine | Admitting: Emergency Medicine

## 2021-01-02 ENCOUNTER — Other Ambulatory Visit: Payer: Self-pay

## 2021-01-02 DIAGNOSIS — F411 Generalized anxiety disorder: Secondary | ICD-10-CM

## 2021-01-02 DIAGNOSIS — Z7689 Persons encountering health services in other specified circumstances: Secondary | ICD-10-CM | POA: Insufficient documentation

## 2021-01-02 DIAGNOSIS — S61512A Laceration without foreign body of left wrist, initial encounter: Secondary | ICD-10-CM | POA: Insufficient documentation

## 2021-01-02 DIAGNOSIS — F332 Major depressive disorder, recurrent severe without psychotic features: Secondary | ICD-10-CM | POA: Insufficient documentation

## 2021-01-02 DIAGNOSIS — R319 Hematuria, unspecified: Secondary | ICD-10-CM | POA: Diagnosis present

## 2021-01-02 DIAGNOSIS — F1910 Other psychoactive substance abuse, uncomplicated: Secondary | ICD-10-CM | POA: Insufficient documentation

## 2021-01-02 DIAGNOSIS — T50901A Poisoning by unspecified drugs, medicaments and biological substances, accidental (unintentional), initial encounter: Secondary | ICD-10-CM | POA: Insufficient documentation

## 2021-01-02 DIAGNOSIS — W268XXA Contact with other sharp object(s), not elsewhere classified, initial encounter: Secondary | ICD-10-CM | POA: Insufficient documentation

## 2021-01-02 DIAGNOSIS — U071 COVID-19: Secondary | ICD-10-CM | POA: Insufficient documentation

## 2021-01-02 DIAGNOSIS — F17210 Nicotine dependence, cigarettes, uncomplicated: Secondary | ICD-10-CM | POA: Insufficient documentation

## 2021-01-02 LAB — COMPREHENSIVE METABOLIC PANEL
ALT: 51 U/L — ABNORMAL HIGH (ref 0–44)
AST: 41 U/L (ref 15–41)
Albumin: 2.9 g/dL — ABNORMAL LOW (ref 3.5–5.0)
Alkaline Phosphatase: 87 U/L (ref 38–126)
Anion gap: 4 — ABNORMAL LOW (ref 5–15)
BUN: 16 mg/dL (ref 6–20)
CO2: 28 mmol/L (ref 22–32)
Calcium: 8.3 mg/dL — ABNORMAL LOW (ref 8.9–10.3)
Chloride: 108 mmol/L (ref 98–111)
Creatinine, Ser: 0.85 mg/dL (ref 0.61–1.24)
GFR, Estimated: >60 mL/min (ref >60)
Glucose, Bld: 101 mg/dL — ABNORMAL HIGH (ref 70–99)
Potassium: 4.5 mmol/L (ref 3.5–5.1)
Sodium: 140 mmol/L (ref 135–145)
Total Bilirubin: 0.5 mg/dL (ref 0.3–1.2)
Total Protein: 5.4 g/dL — ABNORMAL LOW (ref 6.5–8.1)

## 2021-01-02 LAB — URINALYSIS, ROUTINE W REFLEX MICROSCOPIC
Bilirubin Urine: NEGATIVE
Glucose, UA: NEGATIVE mg/dL
Ketones, ur: NEGATIVE mg/dL
Leukocytes,Ua: NEGATIVE
Nitrite: NEGATIVE
Protein, ur: NEGATIVE mg/dL
Specific Gravity, Urine: 1.02 (ref 1.005–1.030)
pH: 6.5 (ref 5.0–8.0)

## 2021-01-02 LAB — CBC
HCT: 40.8 % (ref 39.0–52.0)
Hemoglobin: 13.3 g/dL (ref 13.0–17.0)
MCH: 30 pg (ref 26.0–34.0)
MCHC: 32.6 g/dL (ref 30.0–36.0)
MCV: 91.9 fL (ref 80.0–100.0)
Platelets: 227 10*3/uL (ref 150–400)
RBC: 4.44 MIL/uL (ref 4.22–5.81)
RDW: 13.6 % (ref 11.5–15.5)
WBC: 9.9 10*3/uL (ref 4.0–10.5)
nRBC: 0 % (ref 0.0–0.2)

## 2021-01-02 LAB — MAGNESIUM: Magnesium: 2.1 mg/dL (ref 1.7–2.4)

## 2021-01-02 LAB — URINALYSIS, MICROSCOPIC (REFLEX): WBC, UA: NONE SEEN WBC/hpf (ref 0–5)

## 2021-01-02 MED ORDER — HYDROXYZINE HCL 25 MG PO TABS: 50.0000 mg | ORAL_TABLET | Freq: Four times a day (QID) | ORAL | 0 refills | Status: DC | PRN

## 2021-01-02 MED ORDER — BUSPIRONE HCL 7.5 MG PO TABS: 7.5000 mg | ORAL_TABLET | Freq: Two times a day (BID) | ORAL | 0 refills | Status: AC

## 2021-01-02 MED ORDER — HYDROXYZINE HCL 25 MG PO TABS: 50.0000 mg | ORAL_TABLET | Freq: Four times a day (QID) | ORAL | 1 refills | Status: AC | PRN

## 2021-01-02 MED ORDER — AMOXICILLIN-POT CLAVULANATE 875-125 MG PO TABS: 1.0000 | ORAL_TABLET | Freq: Two times a day (BID) | ORAL | 0 refills | Status: DC

## 2021-01-02 NOTE — ED Notes (Signed)
ED Provider at bedside. 

## 2021-01-02 NOTE — Discharge Instructions (Addendum)
Take tylenol and ibuprofen over the counter as needed for PAIN.   IMPORTANT INFORMATION: PAY CLOSE ATTENTION   PHYSICIAN DISCHARGE INSTRUCTIONS  Follow with Primary care provider  Pcp, No  and other consultants as instructed by your Hospitalist Physician  SEEK MEDICAL CARE OR RETURN TO EMERGENCY ROOM IF SYMPTOMS COME BACK, WORSEN OR NEW PROBLEM DEVELOPS   Please note: You were cared for by a hospitalist during your hospital stay. Every effort will be made to forward records to your primary care provider.  You can request that your primary care provider send for your hospital records if they have not received them.  Once you are discharged, your primary care physician will handle any further medical issues. Please note that NO REFILLS for any discharge medications will be authorized once you are discharged, as it is imperative that you return to your primary care physician (or establish a relationship with a primary care physician if you do not have one) for your post hospital discharge needs so that they can reassess your need for medications and monitor your lab values.  Please get a complete blood count and chemistry panel checked by your Primary MD at your next visit, and again as instructed by your Primary MD.  Get Medicines reviewed and adjusted: Please take all your medications with you for your next visit with your Primary MD  Laboratory/radiological data: Please request your Primary MD to go over all hospital tests and procedure/radiological results at the follow up, please ask your primary care provider to get all Hospital records sent to his/her office.  In some cases, they will be blood work, cultures and biopsy results pending at the time of your discharge. Please request that your primary care provider follow up on these results.  If you are diabetic, please bring your blood sugar readings with you to your follow up appointment with primary care.    Please call and make your  follow up appointments as soon as possible.    Also Note the following: If you experience worsening of your admission symptoms, develop shortness of breath, life threatening emergency, suicidal or homicidal thoughts you must seek medical attention immediately by calling 911 or calling your MD immediately  if symptoms less severe.  You must read complete instructions/literature along with all the possible adverse reactions/side effects for all the Medicines you take and that have been prescribed to you. Take any new Medicines after you have completely understood and accpet all the possible adverse reactions/side effects.   Do not drive when taking Pain medications or sleeping medications (Benzodiazepines)  Do not take more than prescribed Pain, Sleep and Anxiety Medications. It is not advisable to combine anxiety,sleep and pain medications without talking with your primary care practitioner  Special Instructions: If you have smoked or chewed Tobacco  in the last 2 yrs please stop smoking, stop any regular Alcohol  and or any Recreational drug use.  Wear Seat belts while driving.  Do not drive if taking any narcotic, mind altering or controlled substances or recreational drugs or alcohol.

## 2021-01-02 NOTE — Progress Notes (Signed)
This LPN went into patients room at 0400 to hang medicine. Patient asleep. This LPN noted a can of green Grizzly long stem tobacco and a single Marlboro cigarette on side table. Items confiscated, items placed in bag with patient label on them and placed inside patient folder. Made charge RN aware.

## 2021-01-02 NOTE — TOC Transition Note (Signed)
Transition of Care [facility]) - CM/SW Discharge Note   Patient Details  Name: Brad Todd MRN: [id number] Date of Birth: [birth year]  [facility] CM/SW Contact:  Barry Brunner, LCSW Phone Number: 01/02/2021, 11:01 AM   Clinical Narrative:    CSW notified of patient's readiness for discharge and need for SA resources. CSW provided patient with SA resources. Patient agreeable to review list. TOC signing off.   Final next level of care: Home/Self Care Barriers to Discharge: Barriers Resolved   Patient Goals and CMS Choice Patient states their goals for this hospitalization and ongoing recovery are:: Return home CMS Medicare.gov Compare Post Acute Care list provided to:: Patient Choice offered to / list presented to : Patient  Discharge Placement                    Patient and family notified of of transfer: 01/02/21  Discharge Plan and Services                                     Social Determinants of Health (SDOH) Interventions     Readmission Risk Interventions No flowsheet data found.

## 2021-01-02 NOTE — ED Triage Notes (Signed)
Pt states he is here due to laceration to left wrist. Pt states he was reaching down and cut it on piece of tin. Pt denies SI and HI. Pt father told registration he believes it was intentional act to harm himself.

## 2021-01-02 NOTE — Discharge Summary (Signed)
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 HOURS Performed at [facility], [address], [place], [place] [address]    Report Status PENDING  Incomplete  Resp Panel by RT-PCR (Flu A&B, Covid) Nasopharyngeal Swab     Status: None   Collection Time: 01/01/21  3:54 AM   Specimen: Nasopharyngeal Swab; Nasopharyngeal(NP) swabs in vial transport medium  Result Value Ref Range Status   SARS Coronavirus 2 by RT PCR NEGATIVE NEGATIVE Final    Comment: (NOTE) SARS-CoV-2 target nucleic acids are NOT DETECTED.  The SARS-CoV-2 RNA is generally detectable in upper respiratory specimens during the acute phase of infection. The lowest concentration of SARS-CoV-2 viral copies this assay can detect is 138 copies/mL. A negative result does not preclude SARS-Cov-2 infection and should not be used as the sole  basis for treatment or other patient management decisions.  A negative result may occur with  improper specimen collection/handling, submission of specimen other than nasopharyngeal swab, presence of viral mutation(s) within the areas targeted by this assay, and inadequate number of viral copies(<138 copies/mL). A negative result must be combined with clinical observations, patient history, and epidemiological information. The expected result is Negative.  Fact Sheet for Patients:  EntrepreneurPulse.com.au  Fact Sheet for Healthcare Providers:  IncredibleEmployment.be  This test is no t yet approved or cleared by the [place] FDA and  has been authorized for detection and/or diagnosis of SARS-CoV-2 by FDA under an Emergency Use Authorization (EUA). This EUA will remain  in effect (meaning this test can be used) for the duration of the COVID-19 declaration under Section 564(b)(1) of the Act, 21 U.S.C.section 360bbb-3(b)(1), unless the authorization is terminated  or revoked sooner.       Influenza A by PCR NEGATIVE NEGATIVE Final   Influenza B by PCR NEGATIVE NEGATIVE Final    Comment: (NOTE) The Xpert Xpress SARS-CoV-2/FLU/RSV plus assay is intended as an aid in the diagnosis of influenza from Nasopharyngeal swab specimens and should not be used as a sole basis for treatment. Nasal washings and aspirates are unacceptable for Xpert Xpress SARS-CoV-2/FLU/RSV testing.  Fact Sheet for Patients: EntrepreneurPulse.com.au  Fact Sheet for Healthcare Providers: IncredibleEmployment.be  This test is not yet approved or cleared by the [place] FDA and has been authorized for detection and/or diagnosis of SARS-CoV-2 by FDA under an Emergency Use Authorization (EUA). This EUA will remain in effect (meaning this test can be used) for the duration of the COVID-19 declaration under Section 564(b)(1) of the Act,  21 U.S.C. section 360bbb-3(b)(1), unless the authorization is terminated or revoked.  Performed at [facility], [address], [place], [place] [address]   Blood culture (routine x 2)     Status: None (Preliminary result)   Collection Time: 01/01/21  3:57 AM   Specimen: Blood  Result Value Ref Range Status   Specimen Description BLOOD RIGHT HAND  Final   Special Requests   Final    BOTTLES DRAWN AEROBIC AND ANAEROBIC Blood Culture adequate volume   Culture   Final    NO GROWTH < 12 HOURS Performed at [facility], [address], [place], [place] [address]    Report Status PENDING  Incomplete   Time coordinating discharge:  40 mins   SIGNED:  Irwin Brakeman, MD  [facility] 01/02/2021, 10:33 AM How to contact the [facility] Attending or Consulting provider [place] or covering provider during after hours [place], for this patient?  Check the care team in [facility] and look for a) attending/consulting TRH provider listed and b) the [facility] team listed Log into [email or web address] and use [place]'s universal password to access. If you do not have the password, please contact the hospital operator. Locate the [facility] provider you are looking for under [facility] and page to a number that you can be directly reached. If you still have difficulty reaching the provider, please page the [facility] (Director on Call) for the Hospitalists listed on amion for assistance.

## 2021-01-03 LAB — RAPID URINE DRUG SCREEN, HOSP PERFORMED
Amphetamines: NOT DETECTED
Barbiturates: NOT DETECTED
Benzodiazepines: NOT DETECTED
Cocaine: NOT DETECTED
Opiates: NOT DETECTED
Tetrahydrocannabinol: NOT DETECTED

## 2021-01-03 LAB — RESP PANEL BY RT-PCR (FLU A&B, COVID) ARPGX2
Influenza A by PCR: NEGATIVE
Influenza B by PCR: NEGATIVE
SARS Coronavirus 2 by RT PCR: NEGATIVE

## 2021-01-03 LAB — CBC WITH DIFFERENTIAL/PLATELET
Abs Immature Granulocytes: 0.05 10*3/uL (ref 0.00–0.07)
Basophils Absolute: 0 10*3/uL (ref 0.0–0.1)
Basophils Relative: 0 %
Eosinophils Absolute: 0.3 10*3/uL (ref 0.0–0.5)
Eosinophils Relative: 3 %
HCT: 41.5 % (ref 39.0–52.0)
Hemoglobin: 14.1 g/dL (ref 13.0–17.0)
Immature Granulocytes: 1 %
Lymphocytes Relative: 23 %
Lymphs Abs: 2.3 10*3/uL (ref 0.7–4.0)
MCH: 30.1 pg (ref 26.0–34.0)
MCHC: 34 g/dL (ref 30.0–36.0)
MCV: 88.5 fL (ref 80.0–100.0)
Monocytes Absolute: 0.5 10*3/uL (ref 0.1–1.0)
Monocytes Relative: 5 %
Neutro Abs: 6.6 10*3/uL (ref 1.7–7.7)
Neutrophils Relative %: 68 %
Platelets: 261 10*3/uL (ref 150–400)
RBC: 4.69 MIL/uL (ref 4.22–5.81)
RDW: 13.4 % (ref 11.5–15.5)
WBC: 9.8 10*3/uL (ref 4.0–10.5)
nRBC: 0 % (ref 0.0–0.2)

## 2021-01-03 LAB — COMPREHENSIVE METABOLIC PANEL
ALT: 63 U/L — ABNORMAL HIGH (ref 0–44)
AST: 50 U/L — ABNORMAL HIGH (ref 15–41)
Albumin: 3.6 g/dL (ref 3.5–5.0)
Alkaline Phosphatase: 96 U/L (ref 38–126)
Anion gap: 5 (ref 5–15)
BUN: 9 mg/dL (ref 6–20)
CO2: 25 mmol/L (ref 22–32)
Calcium: 8.2 mg/dL — ABNORMAL LOW (ref 8.9–10.3)
Chloride: 108 mmol/L (ref 98–111)
Creatinine, Ser: 0.64 mg/dL (ref 0.61–1.24)
GFR, Estimated: >60 mL/min (ref >60)
Glucose, Bld: 117 mg/dL — ABNORMAL HIGH (ref 70–99)
Potassium: 3.9 mmol/L (ref 3.5–5.1)
Sodium: 138 mmol/L (ref 135–145)
Total Bilirubin: 0.4 mg/dL (ref 0.3–1.2)
Total Protein: 6 g/dL — ABNORMAL LOW (ref 6.5–8.1)

## 2021-01-03 LAB — ACETAMINOPHEN LEVEL: Acetaminophen (Tylenol), Serum: <10 ug/mL — ABNORMAL LOW (ref 10–30)

## 2021-01-03 LAB — SALICYLATE LEVEL: Salicylate Lvl: <7 mg/dL — ABNORMAL LOW (ref 7.0–30.0)

## 2021-01-03 LAB — ETHANOL: Alcohol, Ethyl (B): <10 mg/dL (ref <10)

## 2021-01-03 MED ORDER — STERILE WATER FOR INJECTION IJ SOLN
INTRAMUSCULAR | Status: AC
  Filled 2021-01-03: qty 10

## 2021-01-03 MED ORDER — ZIPRASIDONE MESYLATE 20 MG IM SOLR
20.0000 mg | Freq: Once | INTRAMUSCULAR | Status: AC
  Administered 2021-01-03: 20 mg via INTRAMUSCULAR
  Filled 2021-01-03 (×2): qty 20

## 2021-01-03 MED ORDER — ACETAMINOPHEN 325 MG PO TABS
650.0000 mg | ORAL_TABLET | Freq: Once | ORAL | Status: AC
  Administered 2021-01-03: 650 mg via ORAL
  Filled 2021-01-03: qty 2

## 2021-01-03 MED ORDER — HYDROXYZINE HCL 25 MG PO TABS
50.0000 mg | ORAL_TABLET | Freq: Four times a day (QID) | ORAL | Status: DC | PRN
  Administered 2021-01-03 (×2): 50 mg via ORAL
  Filled 2021-01-03 (×2): qty 2

## 2021-01-03 MED ORDER — BUSPIRONE HCL 5 MG PO TABS
7.5000 mg | ORAL_TABLET | Freq: Two times a day (BID) | ORAL | Status: DC
  Administered 2021-01-03 – 2021-01-05 (×5): 7.5 mg via ORAL
  Filled 2021-01-03 (×5): qty 2

## 2021-01-03 MED ORDER — LIDOCAINE HCL (PF) 1 % IJ SOLN
10.0000 mL | Freq: Once | INTRAMUSCULAR | Status: AC
  Administered 2021-01-03: 10 mL via INTRADERMAL
  Filled 2021-01-03: qty 30

## 2021-01-03 MED ORDER — NICOTINE 21 MG/24HR TD PT24
21.0000 mg | MEDICATED_PATCH | Freq: Every day | TRANSDERMAL | Status: DC
  Administered 2021-01-03 – 2021-01-05 (×4): 21 mg via TRANSDERMAL
  Filled 2021-01-03 (×4): qty 1

## 2021-01-03 NOTE — ED Notes (Signed)
Patient updated on plan of care and given water at this time. NAD noted; no complaints voiced.

## 2021-01-03 NOTE — ED Notes (Signed)
TTS Consult in progress at bedside

## 2021-01-03 NOTE — ED Notes (Signed)
IVC Papers faxed to [facility] after being served to the Pt by [facility].

## 2021-01-03 NOTE — ED Notes (Signed)
ED Provider at bedside. 
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
## 2021-01-03 NOTE — ED Notes (Signed)
Pt wanded by security prior to being changed into marron scrubs. Pt belongings secured in [place] and security wanded Pt again. Pt calm and cooperative at this time denying any SI/HI.

## 2021-01-03 NOTE — BH Assessment (Addendum)
Disposition:   Per first shift LCSW @ 0722: "Patient information has been sent to [facility] via secure chat to review for potential admission. Patient meets inpatient criteria per Roselyn Bering, NP. Situation ongoing, CSW will continue to monitor progress."  @ 1707, informed by the [facility] Richelle Ito, RN), no available beds at [facility] adult unit. Dr. Lucianne Muss has given directive to fax patient's out if no bed availability at [facility].    @1730 , Patient faxed out to the following facilities for consideration of bed placement:   Destination  Service Provider Request Status Selected Services Address Phone Fax Patient Preferred  [facility]  Pending - Request Sent N/A [address], [place] [phone] --  [facility]  Pending - Request Sent N/A [address] [place], [place] [phone] --  [facility]  Pending - Request Sent N/A [address] [place] [phone] --  [facility]  Pending - Request Sent N/A [address] [place] [phone] --  [facility]  Pending - Request Sent N/A [phone] [place] [place] [phone] --  [facility]  Pending - Request Sent N/A [address] [place], [place] [phone] --  [facility]  Pending - Request Sent N/A [address] [place] [address] [place] [address] [phone] --  [facility]  Pending - Request Sent N/A [address] [facility] [place] [phone] --  [facility]  Pending - Request Sent N/A [address] [place] [phone] --  [facility]  Pending - Request Sent N/A [address], [place] [phone] --  [facility]  Pending -  Request Sent N/A [address], [place] [phone] --  [facility]  Pending - Request Sent N/A [address] [place] [address] [phone] --  [facility]  Pending - Request Sent N/A [address] [place] [phone] --  [facility]  Pending - Request Sent N/A [address] [place] [phone] --  [facility]  Pending - Request Sent N/A [address] [place] [phone]

## 2021-01-03 NOTE — ED Notes (Signed)
Patient resting in bed with eyes closed. Respirations even and unlabored. NAD noted. Patient currently waiting on bed [facility].

## 2021-01-03 NOTE — ED Notes (Signed)
Charge RN at bedside to explain IVC procedure due to patient becoming more irate. Patient given phone to speak to mom. Security at bedside.

## 2021-01-03 NOTE — ED Notes (Signed)
ED provider messaged at this time due to patient thrashing in the room and becoming more anxious. Awaiting orders
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
## 2021-01-03 NOTE — ED Notes (Signed)
Patient starting to become irritated and cussing and staff and stating "these people back here wont shut the fuck up." Patient has tv on in his room with sitter at bedside.

## 2021-01-03 NOTE — Progress Notes (Signed)
Patient information has been sent to [facility] via secure chat to review for potential admission. Patient meets inpatient criteria per Roselyn Bering, NP.   Situation ongoing, CSW will continue to monitor progress.    Signed:  Damita Dunnings, MSW, LCSW-A  01/03/2021 7:22 AM

## 2021-01-04 ENCOUNTER — Inpatient Hospital Stay (HOSPITAL_COMMUNITY)
Admission: AD | Admit: 2021-01-04 | Payer: Federal, State, Local not specified - Other | Source: Intra-hospital | Admitting: Psychiatry

## 2021-01-04 LAB — RESP PANEL BY RT-PCR (FLU A&B, COVID) ARPGX2
Influenza A by PCR: NEGATIVE
Influenza B by PCR: NEGATIVE
SARS Coronavirus 2 by RT PCR: POSITIVE — AB

## 2021-01-04 MED ORDER — LORAZEPAM 1 MG PO TABS
1.0000 mg | ORAL_TABLET | Freq: Four times a day (QID) | ORAL | Status: DC | PRN
  Administered 2021-01-04 – 2021-01-05 (×2): 1 mg via ORAL
  Filled 2021-01-04 (×2): qty 1

## 2021-01-04 MED ORDER — ZIPRASIDONE MESYLATE 20 MG IM SOLR
20.0000 mg | Freq: Once | INTRAMUSCULAR | Status: AC | PRN
  Administered 2021-01-04: 20 mg via INTRAMUSCULAR
  Filled 2021-01-04: qty 20

## 2021-01-04 MED ORDER — ARIPIPRAZOLE 5 MG PO TABS
5.0000 mg | ORAL_TABLET | Freq: Once | ORAL | Status: AC
  Administered 2021-01-04: 5 mg via ORAL
  Filled 2021-01-04: qty 1

## 2021-01-04 MED ORDER — LORAZEPAM 2 MG/ML IJ SOLN
1.0000 mg | Freq: Once | INTRAMUSCULAR | Status: AC | PRN
  Administered 2021-01-04: 1 mg via INTRAMUSCULAR
  Filled 2021-01-04: qty 1

## 2021-01-04 NOTE — ED Notes (Signed)
At approx 1845 Pt stormed out of room after updated on Covid positive status and change of plans with discharge to [facility] as he could no longer go. He ran down the hallway without a mask on. Security called and [place] [employer] met pt outside. He because increasingly agitated stating he was going "the fuck home". Attempted to explain to patient why he could not leave at this time and pt became more upset. Pt detained by [employer] and escorted back to room. Pt currently cuffed to bed in one point wrist restraint.  [employer] at bedside. Pt has prn geodon ordered and this was given to pt by RN.
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
## 2021-01-04 NOTE — Consult Note (Signed)
Admission orders complete. EKG ordered. Last EKG done 01/01/21: QT/QTC 306/440 Abilify 5 mg PO QD ordered x 1 dose while in ED.

## 2021-01-04 NOTE — ED Notes (Signed)
Pt sleeping. Vitals to be obtained when awake

## 2021-01-04 NOTE — ED Notes (Signed)
Pt up in room pacing and opening doors of cabinets, as well as hitting the mattress. Cabinets retaped and locked. Spoke with who states he is just feeling increased anxiety right now and feels the hydroxyzine has recently not been helping as much. EDP notified of behavior and concerns.

## 2021-01-04 NOTE — ED Notes (Signed)
Orlean Patten RN with [facility] states they will have a bed for this pt tonight at 10pm. Notified Lillia Abed that IVC paperwork was faxed to [facility] this morning. They will need a new covid swab as well.

## 2021-01-04 NOTE — ED Notes (Signed)
Sitter, Steward Drone, is monitoring the patient. Patient laying on right side on stretcher. Respirations are even and unlabored.

## 2021-01-04 NOTE — ED Notes (Signed)
Patient took a shower and given clean scrubs to put on

## 2021-01-05 MED ORDER — ACETAMINOPHEN 325 MG PO TABS
650.0000 mg | ORAL_TABLET | Freq: Four times a day (QID) | ORAL | Status: DC | PRN
Start: 2021-01-05 — End: 2021-01-05
  Administered 2021-01-05: 650 mg via ORAL
  Filled 2021-01-05: qty 2

## 2021-01-05 MED ORDER — ZOLPIDEM TARTRATE 5 MG PO TABS
5.0000 mg | ORAL_TABLET | Freq: Once | ORAL | Status: AC
  Administered 2021-01-05: 5 mg via ORAL
  Filled 2021-01-05: qty 1

## 2021-01-05 MED ORDER — ARIPIPRAZOLE 5 MG PO TABS: 5.0000 mg | ORAL_TABLET | Freq: Every day | ORAL | 2 refills | Status: AC

## 2021-01-05 NOTE — Discharge Instructions (Addendum)
Brad Todd was evaluated over 3 days in the the ER by our psychiatric team.  He showed improvement of his mood and behavior while here in the emergency department.  The psychiatry team felt that he would need outpatient therapy, but did not require inpatient management at this time. They encourage him to stay sober, as this may be a cause of his outbursts and behavior.  They also recommend that Brad Todd starts taking Abilify 5 mg daily to stabilize his moods.  A prescription for this was sent to his pharmacy.  Please pick this up today.  Please call the number above to arrange for follow-up appointment with the psychiatry clinic.  There are also resource numbers above for detox and drug rehab centers.    Finally, there are 2 important things to remember.  Brad Todd tested positive for Covid today.  This means he either has COVID now, or he had it in the past 2 months.  Has have his recommendation is for him to isolate or quarantine for another 5 days.  Finally, 5 stitches were placed in Brad Todd's wrist.  The stitches need to be removed in 5-7 days (August 1st-3rd).  This can be done at an urgent care, doctors office, or back in the ER.  Please make sure the stitches are removed, they can get infected if left in longer.  His wounds can just be washed with regular soap and water.

## 2021-01-05 NOTE — BH Assessment (Signed)
Disposition:   @ 2350, notified [facility] (Tosin, RN) of patient's bed needs. Patient under review for admission at [facility].

## 2021-01-05 NOTE — ED Notes (Signed)
Request for sleep medication and tylenol order per patient request made to ED MD.

## 2021-01-05 NOTE — ED Provider Notes (Signed)
I spoke to the psychiatry and behavioral health services today reported that they had reevaluated the patient and felt that this time he was stable for discharge.  Patient was calm and cooperative and denied suicidal homicidal ideations.  The psych team did feel that his symptoms may be related to polysubstance abuse, which was a feeling that was also endorsed by his parents.  We cannot advised and encourage sobriety, patient is aware of his COVID diagnosis.  At this time the IVC is rescinded and he will be discharged home with outpatient resources.  TTS services tell me they have spoken to his parents.   Terald Sleeper, MD 01/05/21 1258

## 2021-01-05 NOTE — ED Notes (Signed)
Portable tele psych interview started with pt

## 2021-01-05 NOTE — ED Notes (Signed)
Report received from [facility], pt currently calm and cooperative, pt was asking for some medication for his anxiety. Pt denies suicidal/homicidal ideation at this time

## 2021-01-05 NOTE — Consult Note (Signed)
Telepsych Consultation   Reason for Consult: Major depressive disorder Referring Physician: Emergency room physician Location of Patient: [facility] Location of Provider: [facility]  Patient Identification: NASIRE REALI MRN:  [id number] Principal Diagnosis: <principal problem not specified> Diagnosis:  Active Problems:   * No active hospital problems. *   Total Time spent with patient: 15 minutes  Subjective:   JAYDIEN PANEPINTO is a 30 y.o. male was seen and evaluated via tele-assessment.  He adamantly denies cutting his wrists was a suicide attempt.  Stated he attempted to pick up something for the floor and most of his weight scraped on the side of the "jagged tin can."  Denied previous inpatient admissions.  Denied previous suicide attempts.  Denies auditory or visual hallucinations currently.  Continues to deny suicidal or homicidal ideations.  Reports he would like to follow-up with therapy and psychiatry on outpatient basis.  NP spoke to patient's father for additional collateral.  Father reports patient needs help with his substance abuse.  "  He had multiple overdose attempts he needs help."  Reports patient experienced ongoing paranoia and auditory hallucinations.  Father provided mother's contact information.  NP was unable to reach mother at [phone].  Case staffed with attending psychiatrist Lucianne Muss.  Will recommend patient follow-up with additional outpatient resources for substance abuse and psychiatry/therapy.  EDP to rescind involuntary commitment.  Support, encouragement and reassurance was provided.  HPI: Per admission assessment note:Patient presented to the [facility] under IVC accompanied by LE after cutting his wrist. Per IVC, pt's father believed that the cut was self-inflicted intentionally. Pt denied SI or NSSH. Per IVC, mother believes pt is hearing voices telling him to hurt himself. Pt denies any AVH for the past year. Pt stated that the cut to his wrist  was accidental but did not explain how it occurred. Pt denied any drug or alcohol use in the last 24 hours and his ETOH and UDS was negative.  Past Psychiatric History:   Risk to Self:   Risk to Others:   Prior Inpatient Therapy:   Prior Outpatient Therapy:    Past Medical History:  Past Medical History:  Diagnosis Date   Chronic shoulder pain    Polysubstance abuse (HCC)    cocaine, opiates, benzos   Rhabdomyolysis 2011   accutane induced    Past Surgical History:  Procedure Laterality Date   ORIF PROXIMAL HUMERUS FRACTURE  05/2014   Family History: History reviewed. No pertinent family history. Family Psychiatric  History:  Social History:  Social History   Substance and Sexual Activity  Alcohol Use Yes   Alcohol/week: 2.0 standard drinks   Types: 2 Cans of beer per week   Comment: daily     Social History   Substance and Sexual Activity  Drug Use Yes   Types: Marijuana, Methamphetamines, Cocaine   Comment: heroin    Social History   Socioeconomic History   Marital status: Single    Spouse name: Not on file   Number of children: Not on file   Years of education: Not on file   Highest education level: Not on file  Occupational History   Not on file  Tobacco Use   Smoking status: Every Day    Packs/day: 1.50    Types: Cigarettes   Smokeless tobacco: Never  Vaping Use   Vaping Use: Never used  Substance and Sexual Activity   Alcohol use: Yes    Alcohol/week: 2.0 standard drinks    Types: 2  Cans of beer per week    Comment: daily   Drug use: Yes    Types: Marijuana, Methamphetamines, Cocaine    Comment: heroin   Sexual activity: Not Currently  Other Topics Concern   Not on file  Social History Narrative   Not on file   Social Determinants of Health   Financial Resource Strain: Not on file  Food Insecurity: Not on file  Transportation Needs: Not on file  Physical Activity: Not on file  Stress: Not on file  Social Connections: Not on file    Additional Social History:    Allergies:  No Known Allergies  Labs:  Results for orders placed or performed during the hospital encounter of 01/02/21 (from the past 48 hour(s))  Resp Panel by RT-PCR (Flu A&B, Covid) Nasopharyngeal Swab     Status: Abnormal   Collection Time: 01/04/21  4:53 PM   Specimen: Nasopharyngeal Swab; Nasopharyngeal(NP) swabs in vial transport medium  Result Value Ref Range   SARS Coronavirus 2 by RT PCR POSITIVE (A) NEGATIVE    Comment: CRITICAL RESULT CALLED TO, READ BACK BY AND VERIFIED WITH: VIFER,M 1824 01/04/2021 COLEMAN,R (NOTE) SARS-CoV-2 target nucleic acids are DETECTED.  The SARS-CoV-2 RNA is generally detectable in upper respiratory specimens during the acute phase of infection. Positive results are indicative of the presence of the identified virus, but do not rule out bacterial infection or co-infection with other pathogens not detected by the test. Clinical correlation with patient history and other diagnostic information is necessary to determine patient infection status. The expected result is Negative.  Fact Sheet for Patients: BloggerCourse.com  Fact Sheet for Healthcare Providers: SeriousBroker.it  This test is not yet approved or cleared by the [place] FDA and  has been authorized for detection and/or diagnosis of SARS-CoV-2 by FDA under an Emergency Use Authorization (EUA).  This EUA will remain in effect (meaning this te st can be used) for the duration of  the COVID-19 declaration under Section 564(b)(1) of the Act, 21 U.S.C. section 360bbb-3(b)(1), unless the authorization is terminated or revoked sooner.     Influenza A by PCR NEGATIVE NEGATIVE   Influenza B by PCR NEGATIVE NEGATIVE    Comment: (NOTE) The Xpert Xpress SARS-CoV-2/FLU/RSV plus assay is intended as an aid in the diagnosis of influenza from Nasopharyngeal swab specimens and should not be used as a sole  basis for treatment. Nasal washings and aspirates are unacceptable for Xpert Xpress SARS-CoV-2/FLU/RSV testing.  Fact Sheet for Patients: BloggerCourse.com  Fact Sheet for Healthcare Providers: SeriousBroker.it  This test is not yet approved or cleared by the [place] FDA and has been authorized for detection and/or diagnosis of SARS-CoV-2 by FDA under an Emergency Use Authorization (EUA). This EUA will remain in effect (meaning this test can be used) for the duration of the COVID-19 declaration under Section 564(b)(1) of the Act, 21 U.S.C. section 360bbb-3(b)(1), unless the authorization is terminated or revoked.  Performed at [facility], [address] [place], [place] [address]     Medications:  Current Facility-Administered Medications  Medication Dose Route Frequency Provider Last Rate Last Admin   acetaminophen (TYLENOL) tablet 650 mg  650 mg Oral Q6H PRN Rancour, Jeannett Senior, MD   650 mg at 01/05/21 0127   busPIRone (BUSPAR) tablet 7.5 mg  7.5 mg Oral BID Terrilee Files, MD   7.5 mg at 01/05/21 0939   LORazepam (ATIVAN) tablet 1 mg  1 mg Oral Q6H PRN Vanetta Mulders, MD   1 mg  at 01/05/21 1130   nicotine (NICODERM CQ - dosed in mg/24 hours) patch 21 mg  21 mg Transdermal Daily Terrilee Files, MD   21 mg at 01/05/21 7867   Current Outpatient Medications  Medication Sig Dispense Refill   busPIRone (BUSPAR) 7.5 MG tablet Take 1 tablet (7.5 mg total) by mouth 2 (two) times daily. (Patient not taking: No sig reported) 60 tablet 0   hydrOXYzine (ATARAX/VISTARIL) 25 MG tablet Take 2 tablets (50 mg total) by mouth every 6 (six) hours as needed for anxiety. (Patient not taking: No sig reported) 30 tablet 1    Musculoskeletal: Strength & Muscle Tone: within normal limits Gait & Station: normal Patient leans: N/A          Psychiatric Specialty Exam:  Presentation  General Appearance: Appropriate for  Environment; Casual  Eye Contact:Good  Speech:Clear and Coherent; Normal Rate  Speech Volume:Normal  Handedness:Right   Mood and Affect  Mood:Anxious  Affect:Appropriate; Congruent   Thought Process  Thought Processes:Coherent; Goal Directed  Descriptions of Associations:Intact  Orientation:Full (Time, Place and Person)  Thought Content:Logical  History of Schizophrenia/Schizoaffective disorder:No  Duration of Psychotic Symptoms:No data recorded Hallucinations:No data recorded Ideas of Reference:None  Suicidal Thoughts:No data recorded Homicidal Thoughts:No data recorded  Sensorium  Memory:Immediate Good; Recent Good; Remote Good  Judgment:Fair  Insight:Fair   Executive Functions  Concentration:Fair  Attention Span:Good  Recall:Good  Fund of Knowledge:Good  Language:Good   Psychomotor Activity  Psychomotor Activity: No data recorded  Assets  Assets:Communication Skills; Desire for Improvement; Financial Resources/Insurance; Housing; Intimacy; Leisure Time; Physical Health; Resilience; Social Support   Sleep  Sleep: No data recorded   Physical Exam: Physical Exam Vitals and nursing note reviewed.  Psychiatric:        Mood and Affect: Mood normal.        Behavior: Behavior normal.   ROS Blood pressure 122/88, pulse 78, temperature 98 F (36.7 C), resp. rate 16, height 5\' 7"  (1.702 m), weight 75 kg, SpO2 99 %. Body mass index is 25.9 kg/m.    Disposition: No evidence of imminent risk to self or others at present.   Patient does not meet criteria for psychiatric inpatient admission. Supportive therapy provided about ongoing stressors. Refer to IOP. Discussed crisis plan, support from social network, calling 911, coming to the Emergency Department, and calling Suicide Hotline. Substance abuse resources to refer added for outpatient follow-up  This service was provided via telemedicine using a 2-way, interactive audio and video  technology.  Names of all persons participating in this telemedicine service and their role in this encounter. Name: Clemente Dewey Role: Patient  Name: T.Jakaylah Schlafer Role: NP   Name:  Role:   Name:  Role:     Bea Graff, NP 01/05/2021 12:34 PM

## 2021-01-06 LAB — CULTURE, BLOOD (ROUTINE X 2)
Culture: NO GROWTH
Culture: NO GROWTH
Special Requests: ADEQUATE
Special Requests: ADEQUATE

## 2021-03-02 ENCOUNTER — Ambulatory Visit: Payer: Self-pay | Admitting: Urology

## 2021-03-02 DIAGNOSIS — R310 Gross hematuria: Secondary | ICD-10-CM

## 2021-03-04 ENCOUNTER — Other Ambulatory Visit: Payer: Self-pay

## 2021-03-04 ENCOUNTER — Emergency Department (HOSPITAL_COMMUNITY)
Admission: EM | Admit: 2021-03-04 | Discharge: 2021-03-04 | Disposition: A | Discharge status: Home / Self Care | Payer: Medicaid Other | Attending: Emergency Medicine | Admitting: Emergency Medicine

## 2021-03-04 ENCOUNTER — Encounter (HOSPITAL_COMMUNITY): Payer: Self-pay

## 2021-03-04 DIAGNOSIS — F17210 Nicotine dependence, cigarettes, uncomplicated: Secondary | ICD-10-CM | POA: Insufficient documentation

## 2021-03-04 DIAGNOSIS — T50901A Poisoning by unspecified drugs, medicaments and biological substances, accidental (unintentional), initial encounter: Secondary | ICD-10-CM

## 2021-03-04 DIAGNOSIS — F1910 Other psychoactive substance abuse, uncomplicated: Secondary | ICD-10-CM | POA: Insufficient documentation

## 2021-03-04 DIAGNOSIS — T40411A Poisoning by fentanyl or fentanyl analogs, accidental (unintentional), initial encounter: Secondary | ICD-10-CM | POA: Insufficient documentation

## 2021-03-04 LAB — COMPREHENSIVE METABOLIC PANEL
ALT: 109 U/L — ABNORMAL HIGH (ref 0–44)
AST: 75 U/L — ABNORMAL HIGH (ref 15–41)
Albumin: 4.5 g/dL (ref 3.5–5.0)
Alkaline Phosphatase: 106 U/L (ref 38–126)
Anion gap: 10 (ref 5–15)
BUN: 10 mg/dL (ref 6–20)
CO2: 27 mmol/L (ref 22–32)
Calcium: 9.2 mg/dL (ref 8.9–10.3)
Chloride: 100 mmol/L (ref 98–111)
Creatinine, Ser: 1.18 mg/dL (ref 0.61–1.24)
GFR, Estimated: >60 mL/min (ref >60)
Glucose, Bld: 81 mg/dL (ref 70–99)
Potassium: 3.2 mmol/L — ABNORMAL LOW (ref 3.5–5.1)
Sodium: 137 mmol/L (ref 135–145)
Total Bilirubin: 0.6 mg/dL (ref 0.3–1.2)
Total Protein: 7.2 g/dL (ref 6.5–8.1)

## 2021-03-04 LAB — CBC WITH DIFFERENTIAL/PLATELET
Abs Immature Granulocytes: 0.04 10*3/uL (ref 0.00–0.07)
Basophils Absolute: 0 10*3/uL (ref 0.0–0.1)
Basophils Relative: 0 %
Eosinophils Absolute: 0 10*3/uL (ref 0.0–0.5)
Eosinophils Relative: 0 %
HCT: 43.6 % (ref 39.0–52.0)
Hemoglobin: 14.9 g/dL (ref 13.0–17.0)
Immature Granulocytes: 0 %
Lymphocytes Relative: 15 %
Lymphs Abs: 1.6 10*3/uL (ref 0.7–4.0)
MCH: 29.4 pg (ref 26.0–34.0)
MCHC: 34.2 g/dL (ref 30.0–36.0)
MCV: 86 fL (ref 80.0–100.0)
Monocytes Absolute: 0.9 10*3/uL (ref 0.1–1.0)
Monocytes Relative: 8 %
Neutro Abs: 8.3 10*3/uL — ABNORMAL HIGH (ref 1.7–7.7)
Neutrophils Relative %: 77 %
Platelets: 206 10*3/uL (ref 150–400)
RBC: 5.07 MIL/uL (ref 4.22–5.81)
RDW: 12.3 % (ref 11.5–15.5)
WBC: 10.9 10*3/uL — ABNORMAL HIGH (ref 4.0–10.5)
nRBC: 0 % (ref 0.0–0.2)

## 2021-03-04 LAB — ACETAMINOPHEN LEVEL: Acetaminophen (Tylenol), Serum: <10 ug/mL — ABNORMAL LOW (ref 10–30)

## 2021-03-04 MED ORDER — SODIUM CHLORIDE 0.9 % IV BOLUS
1000.0000 mL | Freq: Once | INTRAVENOUS | Status: AC
  Administered 2021-03-04: 1000 mL via INTRAVENOUS

## 2021-03-04 MED ORDER — SODIUM CHLORIDE 0.9 % IV SOLN: INTRAVENOUS | Status: DC

## 2021-03-04 NOTE — Discharge Instructions (Signed)
Follow-up as needed.  Information provided to help you with substance abuse.

## 2021-03-04 NOTE — ED Triage Notes (Signed)
Pt to er via sheriff, per sheriff pt overdosed today on fentanyl, states that he was given narcan internasal twice and iv once, pt awake and talking, pt ambulatory to er room number one.
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
## 2021-05-30 ENCOUNTER — Encounter (HOSPITAL_COMMUNITY): Payer: Self-pay

## 2021-05-30 ENCOUNTER — Other Ambulatory Visit: Payer: Self-pay

## 2021-05-30 ENCOUNTER — Ambulatory Visit (HOSPITAL_COMMUNITY)
Admission: RE | Admit: 2021-05-30 | Payer: No Typology Code available for payment source | Source: Ambulatory Visit | Admitting: Emergency Medicine

## 2021-05-30 ENCOUNTER — Ambulatory Visit (HOSPITAL_COMMUNITY)
Admission: EM | Admit: 2021-05-30 | Discharge: 2021-05-30 | Disposition: A | Discharge status: Home / Self Care | Payer: No Typology Code available for payment source | Source: Ambulatory Visit | Attending: Emergency Medicine | Admitting: Emergency Medicine

## 2021-05-30 ENCOUNTER — Emergency Department (HOSPITAL_COMMUNITY)
Admission: EM | Admit: 2021-05-30 | Discharge: 2021-05-30 | Disposition: A | Discharge status: Home / Self Care | Payer: Medicaid Other | Attending: Emergency Medicine | Admitting: Emergency Medicine

## 2021-05-30 DIAGNOSIS — Z0441 Encounter for examination and observation following alleged adult rape: Secondary | ICD-10-CM | POA: Insufficient documentation

## 2021-05-30 DIAGNOSIS — X58XXXA Exposure to other specified factors, initial encounter: Secondary | ICD-10-CM | POA: Insufficient documentation

## 2021-05-30 DIAGNOSIS — S3660XA Unspecified injury of rectum, initial encounter: Secondary | ICD-10-CM | POA: Insufficient documentation

## 2021-05-30 DIAGNOSIS — F17210 Nicotine dependence, cigarettes, uncomplicated: Secondary | ICD-10-CM | POA: Insufficient documentation

## 2021-05-30 DIAGNOSIS — T71193A Asphyxiation due to mechanical threat to breathing due to other causes, assault, initial encounter: Secondary | ICD-10-CM

## 2021-05-30 DIAGNOSIS — T7421XA Adult sexual abuse, confirmed, initial encounter: Secondary | ICD-10-CM | POA: Insufficient documentation

## 2021-05-30 LAB — CBC WITH DIFFERENTIAL/PLATELET
Abs Immature Granulocytes: 0.01 10*3/uL (ref 0.00–0.07)
Basophils Absolute: 0 10*3/uL (ref 0.0–0.1)
Basophils Relative: 1 %
Eosinophils Absolute: 0.1 10*3/uL (ref 0.0–0.5)
Eosinophils Relative: 1 %
HCT: 46.8 % (ref 39.0–52.0)
Hemoglobin: 15.4 g/dL (ref 13.0–17.0)
Immature Granulocytes: 0 %
Lymphocytes Relative: 38 %
Lymphs Abs: 3.1 10*3/uL (ref 0.7–4.0)
MCH: 27.6 pg (ref 26.0–34.0)
MCHC: 32.9 g/dL (ref 30.0–36.0)
MCV: 83.9 fL (ref 80.0–100.0)
Monocytes Absolute: 0.5 10*3/uL (ref 0.1–1.0)
Monocytes Relative: 6 %
Neutro Abs: 4.5 10*3/uL (ref 1.7–7.7)
Neutrophils Relative %: 54 %
Platelets: 274 10*3/uL (ref 150–400)
RBC: 5.58 MIL/uL (ref 4.22–5.81)
RDW: 13.3 % (ref 11.5–15.5)
WBC: 8.2 10*3/uL (ref 4.0–10.5)
nRBC: 0 % (ref 0.0–0.2)

## 2021-05-30 LAB — COMPREHENSIVE METABOLIC PANEL
ALT: 276 U/L — ABNORMAL HIGH (ref 0–44)
AST: 122 U/L — ABNORMAL HIGH (ref 15–41)
Albumin: 4.4 g/dL (ref 3.5–5.0)
Alkaline Phosphatase: 109 U/L (ref 38–126)
Anion gap: 8 (ref 5–15)
BUN: 8 mg/dL (ref 6–20)
CO2: 24 mmol/L (ref 22–32)
Calcium: 9.2 mg/dL (ref 8.9–10.3)
Chloride: 108 mmol/L (ref 98–111)
Creatinine, Ser: 0.82 mg/dL (ref 0.61–1.24)
GFR, Estimated: >60 mL/min (ref >60)
Glucose, Bld: 83 mg/dL (ref 70–99)
Potassium: 3.7 mmol/L (ref 3.5–5.1)
Sodium: 140 mmol/L (ref 135–145)
Total Bilirubin: 0.8 mg/dL (ref 0.3–1.2)
Total Protein: 7.6 g/dL (ref 6.5–8.1)

## 2021-05-30 MED ORDER — SODIUM CHLORIDE 0.9 % IV BOLUS
1000.0000 mL | Freq: Once | INTRAVENOUS | Status: AC
  Administered 2021-05-30: 19:00:00 1000 mL via INTRAVENOUS

## 2021-05-30 MED ORDER — ACETAMINOPHEN 500 MG PO TABS
1000.0000 mg | ORAL_TABLET | Freq: Once | ORAL | Status: AC
  Administered 2021-05-30: 19:00:00 1000 mg via ORAL
  Filled 2021-05-30: qty 2
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
## 2021-05-30 NOTE — ED Notes (Signed)
Called pt x3 for triage, no response. 
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
## 2021-05-30 NOTE — SANE Note (Signed)
On 05/30/21 at approximately 10:30 pm, and e-mail referral was made to the [facility] Governor Specking) on the pt.'s behalf and per the pt's request.   The pt states it is OK to call and/or leave a message on his cell phone number. [phone])
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
## 2021-05-30 NOTE — SANE Note (Addendum)
At approximately 8:10 pm, The SANE/FNE Teacher, music) consult has been completed. The primary RN and provider (Dr Particia Nearing) have been notified. Please contact the SANE/FNE nurse on call (listed in Amion) with any further concerns.
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
## 2021-05-30 NOTE — ED Notes (Signed)
RN saw pt walking out of ED w/ belongings.
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
## 2021-05-30 NOTE — ED Notes (Signed)
Report given back to primary RN, [employer] officer Felipa Furnace at bedside.  Pt given FNE instructions for DC, still awaiting CT scan and dispo by provider.

## 2021-05-30 NOTE — ED Notes (Signed)
Charge RN called pt, pt stated he felt fine and if something changed he'd come back tomorrow. RN asked pt to remove IV, pt was w/ his father on speaker phone who said that he would make sure it was removed.

## 2021-05-31 ENCOUNTER — Encounter (HOSPITAL_COMMUNITY): Payer: Self-pay

## 2021-05-31 ENCOUNTER — Emergency Department (HOSPITAL_COMMUNITY): Payer: Self-pay

## 2021-05-31 ENCOUNTER — Emergency Department (HOSPITAL_COMMUNITY)
Admission: EM | Admit: 2021-05-31 | Discharge: 2021-05-31 | Disposition: A | Discharge status: Home / Self Care | Payer: Self-pay | Attending: Emergency Medicine | Admitting: Emergency Medicine

## 2021-05-31 ENCOUNTER — Other Ambulatory Visit: Payer: Self-pay

## 2021-05-31 DIAGNOSIS — M25522 Pain in left elbow: Secondary | ICD-10-CM | POA: Insufficient documentation

## 2021-05-31 DIAGNOSIS — S0191XA Laceration without foreign body of unspecified part of head, initial encounter: Secondary | ICD-10-CM | POA: Insufficient documentation

## 2021-05-31 DIAGNOSIS — F17210 Nicotine dependence, cigarettes, uncomplicated: Secondary | ICD-10-CM | POA: Insufficient documentation

## 2021-05-31 DIAGNOSIS — X58XXXA Exposure to other specified factors, initial encounter: Secondary | ICD-10-CM | POA: Insufficient documentation

## 2021-05-31 DIAGNOSIS — M25512 Pain in left shoulder: Secondary | ICD-10-CM

## 2021-05-31 MED ORDER — ACETAMINOPHEN 500 MG PO TABS
1000.0000 mg | ORAL_TABLET | Freq: Once | ORAL | Status: AC
  Administered 2021-05-31: 21:00:00 1000 mg via ORAL
  Filled 2021-05-31: qty 2
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
## 2021-05-31 NOTE — Discharge Instructions (Signed)
Take Tylenol/Motrin as needed for pain
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
## 2023-09-11 DEATH — deceased
